# Patient Record
Sex: Female | Born: 1985 | Race: White | Hispanic: No | Marital: Married | Smoking: Former smoker
Health system: Southern US, Community
[De-identification: ages and names within clinical notes are randomized; demographics above are authoritative.]

## PROBLEM LIST (undated history)

## (undated) ENCOUNTER — Inpatient Hospital Stay (HOSPITAL_COMMUNITY): Payer: Self-pay

## (undated) DIAGNOSIS — B999 Unspecified infectious disease: Secondary | ICD-10-CM

## (undated) DIAGNOSIS — F329 Major depressive disorder, single episode, unspecified: Secondary | ICD-10-CM

## (undated) DIAGNOSIS — D649 Anemia, unspecified: Secondary | ICD-10-CM

## (undated) DIAGNOSIS — O24419 Gestational diabetes mellitus in pregnancy, unspecified control: Secondary | ICD-10-CM

## (undated) DIAGNOSIS — F419 Anxiety disorder, unspecified: Secondary | ICD-10-CM

## (undated) DIAGNOSIS — F32A Depression, unspecified: Secondary | ICD-10-CM

## (undated) DIAGNOSIS — G43909 Migraine, unspecified, not intractable, without status migrainosus: Secondary | ICD-10-CM

## (undated) HISTORY — PX: DILATION AND CURETTAGE OF UTERUS: SHX78

---
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
## 2006-02-21 ENCOUNTER — Ambulatory Visit (HOSPITAL_COMMUNITY): Admission: AD | Admit: 2006-02-21 | Discharge: 2006-02-21 | Payer: Self-pay | Admitting: Obstetrics and Gynecology

## 2006-03-06 ENCOUNTER — Inpatient Hospital Stay (HOSPITAL_COMMUNITY): Admission: RE | Admit: 2006-03-06 | Discharge: 2006-03-08 | Payer: Self-pay | Admitting: Obstetrics and Gynecology

## 2006-04-20 ENCOUNTER — Emergency Department (HOSPITAL_COMMUNITY): Admission: EM | Admit: 2006-04-20 | Discharge: 2006-04-21 | Payer: Self-pay | Admitting: Emergency Medicine

## 2006-04-21 ENCOUNTER — Emergency Department (HOSPITAL_COMMUNITY): Admission: EM | Admit: 2006-04-21 | Discharge: 2006-04-22 | Payer: Self-pay | Admitting: Emergency Medicine

## 2009-09-20 ENCOUNTER — Emergency Department (HOSPITAL_COMMUNITY): Admission: EM | Admit: 2009-09-20 | Discharge: 2009-09-20 | Payer: Self-pay | Admitting: Emergency Medicine

## 2010-04-26 ENCOUNTER — Other Ambulatory Visit
Admission: RE | Admit: 2010-04-26 | Discharge: 2010-04-26 | Payer: Self-pay | Source: Home / Self Care | Admitting: Family Medicine

## 2010-04-26 ENCOUNTER — Other Ambulatory Visit
Admission: RE | Admit: 2010-04-26 | Discharge: 2010-04-26 | Payer: Self-pay | Source: Home / Self Care | Admitting: Unknown Physician Specialty

## 2010-07-09 ENCOUNTER — Emergency Department (HOSPITAL_COMMUNITY)
Admission: EM | Admit: 2010-07-09 | Discharge: 2010-07-09 | Disposition: A | Discharge status: Home / Self Care | Payer: Medicaid Other | Attending: Emergency Medicine | Admitting: Emergency Medicine

## 2010-07-09 DIAGNOSIS — F17200 Nicotine dependence, unspecified, uncomplicated: Secondary | ICD-10-CM | POA: Insufficient documentation

## 2010-07-09 DIAGNOSIS — J029 Acute pharyngitis, unspecified: Secondary | ICD-10-CM | POA: Insufficient documentation

## 2010-07-09 LAB — RAPID STREP SCREEN (MED CTR MEBANE ONLY): Streptococcus, Group A Screen (Direct): NEGATIVE

## 2010-08-26 NOTE — Op Note (Signed)
NAMEKEIRI, SOLANO                   ACCOUNT NO.:  [id number]   MEDICAL RECORD NO.:  [id number]          PATIENT TYPE:  INP   LOCATION:  LDR2                          FACILITY:  [facility]   PHYSICIAN:  Tilda Burrow, M.D. DATE OF BIRTH:  05-06-1985   DATE OF PROCEDURE:  05/10/2004  DATE OF DISCHARGE:                                 OPERATIVE REPORT   DELIVERY NOTE:  The amnioinfusion did, in fact, improve the variable  decelerations, and so the patient was allowed to continue in laboring for  approximately 45 minutes.  An IUPC showed adequate labor, and at  approximately 1445, Dr. Emelda Fear arrived to the floor and upon checking the  patient, she was noted to be +2 station.  She pushed very effectively, and  the baby tolerated the pushing well with only mild variable decelerations.  Kayin had a spontaneous vaginal delivery at 1530.  The mouth and nose were  suctioned on the perineum with a bulb syringe.  A loose nuchal cord was  reduced without difficulty and the baby delivered without difficulty.  Apgars are 8 and 8, weight 5 pounds 15 ounces.  Twenty units of Pitocin  dilated in 1000 mL of lactated Ringer's was then infused rapidly IV.  The  placenta separated spontaneously and was delivered via controlled cord  traction at 1535.  It was inspected and appeared to be intact with a three-  vessel cord.  Of note, the umbilical cord was close to 2 feet long.  The  vagina was inspected, and no lacerations were found.  Estimated blood loss  200 mL.  The epidural catheter was then removed with the blue tip visualized  as being intact.      FC/MEDQ  D:  05/10/2004  T:  05/10/2004  Job:  [id number]   cc:   [facility]

## 2010-08-26 NOTE — Discharge Summary (Signed)
Emma Hughes                   ACCOUNT NO.:  [id number]   MEDICAL RECORD NO.:  [id number]          PATIENT TYPE:  INP   LOCATION:  A418                          FACILITY:  [facility]   PHYSICIAN:  Tilda Burrow, M.D. DATE OF BIRTH:  Jul 13, 1985   DATE OF ADMISSION:  07/25/2005  DATE OF DISCHARGE:  04/19/2007LH                                 DISCHARGE SUMMARY   ADMITTING DIAGNOSES:  1.  Pregnancy first trimester, uncertain dates.  2.  Bronchitis versus pneumonia.   DISCHARGE DIAGNOSES:  1.  Pregnancy six weeks gestation.  2.  Bronchitis, improved.   FOLLOWUP DISCHARGE MEDICATIONS:  1.  Keflex 500 q.i.d. x7 days.  2.  Phenergan with Codeine 4 ounces, 2 teaspoons q.4h. p.r.n. cough.   HOSPITAL SUMMARY:  This 25 year old female gravida 2, para 1, with uncertain  LMP, recently diagnosed with pregnancy, presents with headache, stuffy nose,  cough, fever to 103 when seen in the emergency room.  She was a smoker, but  quit a couple days ago.  She could no longer tolerate the cigarettes.  No  history of drug abuse or alcohol.  Last menstrual period May 15, 2005.  NO KNOWN DRUG ALLERGIES.  She was seen in the emergency room with documented  temperature elevation and with white count 19,400, 85 neutrophils,  hemoglobin 11.1, ketones present on urinalysis, chest showing kind of  chronic airway congestion.   HOSPITAL COURSE:  The patient was admitted for antibiotic therapy and  observation.  She had only one febrile episode after admission to 100.3 and  felt dramatically better on July 27, 2005.  Pharynx showed minimal  erythema, though that was her chief complaint, by the end of the day and on  July 27, 2005, she is discharged at 5 p.m. for followup for routine  prenatal 1 care.  Laboratory data were obtained as follows:  Quantitative  hCG 42,226, blood type O negative, antibody screen negative.  RPR  nonreactive, Rubella absent.  The rest of the prenatal 1 profile was  ordered,  but for some reason it is not in the chart, aerobic and anaerobic  cultures are negative, GC and Chlamydia cultures are negative.   PLAN:  Will draw prenatal 1 profile at followup visit.      Tilda Burrow, M.D.  Electronically Signed    JVF/MEDQ  D:  07/27/2005  T:  07/28/2005  Job:  [id number]

## 2010-08-26 NOTE — Op Note (Signed)
NAMEADORIA, KAWAMOTO                   ACCOUNT NO.:  [id number]   MEDICAL RECORD NO.:  [id number]          PATIENT TYPE:  INP   LOCATION:  A411                          FACILITY:  [facility]   PHYSICIAN:  Tilda Burrow, M.D. DATE OF BIRTH:  21-Jun-1985   DATE OF PROCEDURE:  05/10/2004  DATE OF DISCHARGE:  05/12/2004                                 OPERATIVE REPORT   PROCEDURE:  Continuous lumbar epidural placement was performed on May 10, 2004, in the morning hours.  The patient had epidural placed using loss  of resistance technique at approximately 8:35 a.m. after a fluid bolus of  700 mL lactated Ringer's with 5 mL test dose of 1.5% lidocaine with  epinephrine administered, followed by a continuous infusion of 12 mL/hr.  The patient was early in labor, so no initial bolus was considered  necessary.  The patient had satisfactory analgesic effect and with  continuous infusion of 12 mL/hr. progressed nicely and allowed her to rest  comfortably.      JVF/MEDQ  D:  05/17/2004  T:  05/17/2004  Job:  [id number]

## 2010-08-26 NOTE — H&P (Signed)
NAMEVIANNEY, KOPECKY                   ACCOUNT NO.:  [id number]   MEDICAL RECORD NO.:  [id number]          PATIENT TYPE:  INP   LOCATION:  LDR3                          FACILITY:  [facility]   PHYSICIAN:  Tilda Burrow, M.D. DATE OF BIRTH:  1985-12-08   DATE OF ADMISSION:  03/06/2006  DATE OF DISCHARGE:  LH                              HISTORY & PHYSICAL   CHIEF COMPLAINT:  Questionable rupture of membranes and labor.   HISTORY OF PRESENT ILLNESS:  Emma Hughes is a 25 year old gravida 2, para 1 with  a EDC of March 16, 2006 based on first and second trimester ultrasound  placing her at [redacted] weeks gestation.  She began prenatal care in her first  trimester and has had regular visits since then.  Her prenatal  course  has basically been uneventful.  Prenatal labs include blood type O  negative.  She did receive prophylactic RhoGAM at 28 weeks, rubella  immune, HBsAg, HIV, RPR, gonorrhea, chlamydia and group B strep are all  negative.  She is also HSV2 negative.  Blood pressures have been 100-  130s/50s-80s.  Total weight gain has been about 15 lb with appropriate  fundal height growth.   PAST MEDICAL HISTORY:  None.   PAST SURGICAL HISTORY:  None.   ALLERGIES:  No drug allergies.   MEDICATIONS:  1. Prenatal vitamins.  2. Tylenol 3 p.r.n.   FAMILY HISTORY:  Positive for cancer.   SOCIAL HISTORY:  Single, 8th grade education.  Denies smoking, alcohol  or drug use.   PAST OB HISTORY:  Had a vaginal delivery in 2006 of a 5 lb, 15 oz female  at term.  She did have rather severe postpartum depression.  She was  started on Lexapro 10 mg a couple of weeks ago and we will continue  that.   PHYSICAL EXAMINATION:  HEENT:  Within normal limits.  HEART:  Regular rate and rhythm.  LUNGS:  Clear.  ABDOMEN:  Soft, nontender.  Having moderate contractions every 2 to 3  minutes.  PELVIC EXAM:  Per Dr. Despina Hidden.  Attempted AROM was successful leading him  to believe that she was, in fact, ruptured this  morning.  CERVICAL EXAM:  Upon admission was 1, 25%, minus 2.  She is now 4, 80%,  zero station.  Fetal heart rate is reactive without decels.  She just  received an epidural and is very comfortable.  LEGS:  Negative.   IMPRESSION:  Intrauterine pregnancy at 38 weeks.  Active labor.  Reassuring maternal/fetal status.   PLAN:  Continue present management.      Jacklyn Shell, C.N.M.      Tilda Burrow, M.D.  Electronically Signed    FC/MEDQ  D:  03/06/2006  T:  03/06/2006  Job:  [id number]   cc:   [facility]   Francoise Schaumann. Milford Cage DO, FAAP  Fax: [phone]

## 2010-08-26 NOTE — Op Note (Signed)
NAMEJOYCIE, AERTS                   ACCOUNT NO.:  [id number]   MEDICAL RECORD NO.:  [id number]          PATIENT TYPE:  INP   LOCATION:  A402                          FACILITY:  [facility]   PHYSICIAN:  Lazaro Arms, M.D.   DATE OF BIRTH:  13-Jun-1985   DATE OF PROCEDURE:  DATE OF DISCHARGE:  03/08/2006                               OPERATIVE REPORT   After receiving her epidural, Zenovia progressed very nicely through labor.  She was noted to be completely dilated at +3 station with an urge to  push at approximately 11:45.  She pushed very effectively and brought  the baby to crowning position.  The baby rotated and just came out over  the next push.  The baby was a female and delivered at 12:24.  Apgars  are 9 and 9, weight 6 pounds 5 ounces.  Twenty units of Pitocin dilated  in 1000 mL of lactated Ringer's was infused rapidly IV.  The placenta  separated spontaneously and delivered via controlled cord traction at  12:30.  It was inspected and appears to be intact with a 3-vessel cord.  Estimated blood loss 300 mL.  The vagina was inspected and no  lacerations were found.      Jacklyn Shell, C.N.M.      Lazaro Arms, M.D.  Electronically Signed    FC/MEDQ  D:  03/09/2006  T:  03/09/2006  Job:  [id number]   cc:   Francoise Schaumann. Milford Cage DO, FAAP  Fax: [phone]

## 2010-08-26 NOTE — Op Note (Signed)
Emma Hughes, Emma Hughes                   ACCOUNT NO.:  [id number]   MEDICAL RECORD NO.:  [id number]          PATIENT TYPE:  INP   LOCATION:  LDR3                          FACILITY:  [facility]   PHYSICIAN:  Lazaro Arms, M.D.   DATE OF BIRTH:  05-Mar-1986   DATE OF PROCEDURE:  03/06/2006  DATE OF DISCHARGE:                                 OPERATIVE REPORT   INDICATIONS:  Emma Hughes is a 25 year old white female, gravida 2, para 1, at term,  requesting that epidural be placed.  She came in spontaneous labor.  She is  300 and minus 1.   DESCRIPTION OF PROCEDURE:  She was placed in the sitting position.  The area  was Betadine prepped.  One percent lidocaine was injected in the L3-4  interspace.  The area was sterilely draped.  A 17-gauge Tuohy needle was  used and loss-of-resistance technique employed.  The epidural space was  found with one pass with slight difficulty.  Bupivacaine plain 0.125% , 10  mL, was given as a test dose without ill effects.  The epidural catheter was  fed.  It was taped down 5 cm into the epidural space.  An additional 10 mL  was given through the catheter and continuous infusion was begun at 12 mL an  hour of 0.125% bupivacaine 2 mcg per mL of fentanyl.  The patient tolerated  the procedure well. She is getting good pain relief.  Blood pressure is  stable.  Fetal heart rate tracing is stable.      Lazaro Arms, M.D.  Electronically Signed     LHE/MEDQ  D:  03/06/2006  T:  03/06/2006  Job:  [id number]

## 2010-08-26 NOTE — H&P (Signed)
NAMECHASITEE, ZENKER                   ACCOUNT NO.:  [id number]   MEDICAL RECORD NO.:  [id number]          PATIENT TYPE:  OIB   LOCATION:  LDR2                          FACILITY:  [facility]   PHYSICIAN:  Tilda Burrow, M.D. DATE OF BIRTH:  28-Nov-1985   DATE OF ADMISSION:  02/21/2006  DATE OF DISCHARGE:  LH                                HISTORY & PHYSICAL   She was admitted at 1345 p.m. with irregular uterine contractions.  At that  time, cervix was 1-2 cm __________ closed.  Cervix is thick.  Presenting  part is out of the pelvis.   Exam at time of discharge which is 1715 is the same.  She is having  irregular uterine contractions.  There is fetal heart rate reactivity with  accelerations noted.  Discharge instructions were given to patient.  If  rupture of membranes or regular contractions occur, she is to come back or  let us know.  Also, she desires something for rest. We are going to give her  Nubain 10 and Phenergan 12.5 IM prior to discharge and discharge her home in  the care of her mother.      Zerita Boers, Lanier Clam      Tilda Burrow, M.D.  Electronically Signed    DL/MEDQ  D:  16/01/9603  T:  02/21/2006  Job:  [id number]   cc:   [facility]

## 2010-08-26 NOTE — H&P (Signed)
Emma Hughes, Emma Hughes                   ACCOUNT NO.:  [id number]   MEDICAL RECORD NO.:  [id number]          PATIENT TYPE:  INP   LOCATION:  A416                          FACILITY:  [facility]   PHYSICIAN:  Tilda Burrow, M.D. DATE OF BIRTH:  November 11, 1985   DATE OF ADMISSION:  06/02/2004  DATE OF DISCHARGE:  LH                                HISTORY & PHYSICAL   CHIEF COMPLAINT:  Severe postpartum depression with very limited resources.   HISTORY OF PRESENT ILLNESS:  Emma Hughes is a 25 year old patient who delivered a  baby vaginally approximately two weeks ago here at [facility].  She  had taken her baby to a check-up to see Dr. Stephania Fragmin approximately 10 days ago  where he said she just cried the whole visit.  Therefore, he referred her  over to our office where Dr. Emelda Fear spent some time counseling her and  started her on Lexapro for postpartum depression.  Due to her very limited  resources, she has no transportation, she is at home 12 hours a day by  herself, she does not have any family support whatsoever except for her  boyfriend who works 12 hours a day, [facility] was [facility] to [facility].  I got a call this afternoon from the home  health nurse stating that she has been visiting Emma Hughes and Brandyce has said that  she quit taking her Lexapro yesterday because she thought her thoughts of  suicide were worse.  The home health nurse drove her to the hospital where  she met Dr. Emelda Fear and I and stated that she has had thoughts of picking  up a knife and stabbing herself in her chest.  She does not think that she  would ever do this;  however, there is some uncertainty, and therefore we  admitted her for observation and with the hopes of getting her hooked up  with some resources here to help deal with her postpartum depression.  She  does have her baby with her.  She is breast-feeding and appears very loving  towards her baby.  At this time, we have no indication  whatsoever that she  would harm her baby.  Due to the late hour of the evening, we will keep her  here tonight and help her get hooked into some help with some community  resources in the morning.      FC/MEDQ  D:  06/02/2004  T:  06/02/2004  Job:  [id number]

## 2010-08-26 NOTE — H&P (Signed)
Emma Hughes, Emma Hughes                   ACCOUNT NO.:  [id number]   MEDICAL RECORD NO.:  [id number]          PATIENT TYPE:  INP   LOCATION:  LDR2                          FACILITY:  [facility]   PHYSICIAN:  Tilda Burrow, M.D. DATE OF BIRTH:  1985/10/12   DATE OF ADMISSION:  05/09/2004  DATE OF DISCHARGE:  LH                                HISTORY & PHYSICAL   HISTORY OF PRESENT ILLNESS:  Emma Hughes is a 25 year old, gravida 1, para 0, EDC is  May 07, 2004, who is at 40-2/[redacted] weeks gestation with spontaneous rupture  of membranes at 5 p.m.   PAST MEDICAL HISTORY:  Negative.   PAST SURGICAL HISTORY:  Negative.   ALLERGIES:  No known drug allergies.   SOCIAL HISTORY:  She is single and she lives with the father of the  pregnancy, and he is present and supportive.   FAMILY HISTORY:  Positive for diabetes and cancer.   LABORATORY DATA:  Prenatal course essentially uneventful.  Blood type is O  negative.  UDS was negative.  RhoGAM was given at 28 weeks.  Rubella is  immune.  Hepatitis B surface antigen is negative.  HIV is negative.  HSV is  negative.  Serologies nonreactive.  Pap within normal limits.  GC and  Chlamydia are negative.  AFP normal.  GBS is negative.  Hemoglobin at 28  weeks was 8.9.  Hematocrit at 28 weeks was 30.7.  Glucose 103.  The patient  has been noncompliant with _iron and vitamin_therapy during the course of  the pregnancy.   PHYSICAL EXAMINATION:  VITAL SIGNS:  Stable.  PELVIC:  Membranes are grossly ruptured.  Cervix is fingertip, thick, -2.  She is leaking clear fluid.  Fetal heart tone 130s with good accelerations  present.  Contractions are mild, 3 to 5 minutes apart.   PLAN:  We are going to admit.  If not in active labor by 5 a.m., we are  going to consider Pitocin augmentation at that time if she is less than 3  cm.      DL/MEDQ  D:  04/54/0981  T:  05/09/2004  Job:  [id number]

## 2010-12-15 ENCOUNTER — Encounter: Payer: Medicaid Other | Admitting: Obstetrics & Gynecology

## 2011-05-08 ENCOUNTER — Encounter (HOSPITAL_COMMUNITY): Payer: Self-pay | Admitting: Emergency Medicine

## 2011-05-08 ENCOUNTER — Emergency Department (HOSPITAL_COMMUNITY): Payer: PRIVATE HEALTH INSURANCE

## 2011-05-08 ENCOUNTER — Emergency Department (HOSPITAL_COMMUNITY)
Admission: EM | Admit: 2011-05-08 | Discharge: 2011-05-08 | Disposition: A | Discharge status: Home / Self Care | Payer: PRIVATE HEALTH INSURANCE | Attending: Emergency Medicine | Admitting: Emergency Medicine

## 2011-05-08 ENCOUNTER — Other Ambulatory Visit: Payer: Self-pay

## 2011-05-08 DIAGNOSIS — R110 Nausea: Secondary | ICD-10-CM | POA: Insufficient documentation

## 2011-05-08 DIAGNOSIS — F0781 Postconcussional syndrome: Secondary | ICD-10-CM | POA: Insufficient documentation

## 2011-05-08 DIAGNOSIS — Z79899 Other long term (current) drug therapy: Secondary | ICD-10-CM | POA: Insufficient documentation

## 2011-05-08 DIAGNOSIS — H53149 Visual discomfort, unspecified: Secondary | ICD-10-CM | POA: Insufficient documentation

## 2011-05-08 DIAGNOSIS — R51 Headache: Secondary | ICD-10-CM | POA: Insufficient documentation

## 2011-05-08 DIAGNOSIS — R42 Dizziness and giddiness: Secondary | ICD-10-CM | POA: Insufficient documentation

## 2011-05-08 MED ORDER — DIPHENHYDRAMINE HCL 50 MG/ML IJ SOLN
25.0000 mg | Freq: Once | INTRAMUSCULAR | Status: AC
  Administered 2011-05-08: 25 mg via INTRAVENOUS
  Filled 2011-05-08: qty 1

## 2011-05-08 MED ORDER — PROMETHAZINE HCL 25 MG/ML IJ SOLN
25.0000 mg | Freq: Once | INTRAMUSCULAR | Status: AC
  Administered 2011-05-08: 25 mg via INTRAVENOUS
  Filled 2011-05-08: qty 1

## 2011-05-08 MED ORDER — KETOROLAC TROMETHAMINE 60 MG/2ML IM SOLN
60.0000 mg | Freq: Once | INTRAMUSCULAR | Status: AC
  Administered 2011-05-08: 60 mg via INTRAMUSCULAR
  Filled 2011-05-08: qty 2

## 2011-05-08 NOTE — ED Notes (Signed)
Patient transported to CT 

## 2011-05-08 NOTE — ED Notes (Signed)
Pt states she has a hx of 2 concussions. Most recent concussion pt reports a month and a half ago. Pt states the concussions are a result of two falls. One fall in the bathroom and the most recent, pt reports falling down stairs. Pt c/o new onset of blurry vision, started on Saturday 05/06/11. Pt reports n/v for approx month to month and a half. Pt c/o photophobia, headaches for the same duration. Pt describes the headaches as intermittent, throbbing headaches behind the right eye, and pins and needles all over her head. Pt is A&O x4, GCS of 15, PERRLA, bilateral equal grips and strength, no neurological deficits noted. Pt confirmed she has a MRI on Friday of this week. Abbie RN notified of pt's condition

## 2011-05-08 NOTE — ED Notes (Signed)
Pt states tonight she is having headache and dizziness  Pt states she has had 2 concussions in the past couple months   Pt states she is scheduled for a MRI on Friday at [facility]  Pt states she has been forgetting things like her birthday etc  Pt states she has never experience light headedness like this before  Pt also states her feet keep going to sleep as well

## 2011-05-08 NOTE — ED Provider Notes (Signed)
History     CSN: [id number]  Arrival date & time 05/08/11  4098   First MD Initiated Contact with Patient 05/08/11 2110      Chief Complaint  Patient presents with  . Headache  . Dizziness    (Consider location/radiation/quality/duration/timing/severity/associated sxs/prior treatment) HPI Comments: Patient presents with diffuse headache the onset gradually around 5 PM today is associated with lightheadedness, dizziness, nausea, photophobia. Patient states she's had 2 concussions in the past 2 months. She was seen at [facility] negative he had CT after falling and hitting her head on the coffee table. She then fell a month later down the stairs and hit her head again. He is an MRI scheduled on Friday and sees a neurologist at [facility]. The headache tonight is bothering her to the point that she needed to come in. She denies any visual change, fever, numbness, weakness, tingling. She denies any chest pain or shortness of breath.  The history is provided by the patient.    History reviewed. No pertinent past medical history.  History reviewed. No pertinent past surgical history.  History reviewed. No pertinent family history.  History  Substance Use Topics  . Smoking status: Former Smoker    Types: Cigarettes  . Smokeless tobacco: Not on file  . Alcohol Use: No    OB History    Grav Para Term Preterm Abortions TAB SAB Ect Mult Living                  Review of Systems  Constitutional: Negative for activity change and appetite change.  HENT: Negative for congestion and rhinorrhea.   Respiratory: Negative for cough and shortness of breath.   Cardiovascular: Negative for chest pain.  Gastrointestinal: Positive for nausea. Negative for abdominal pain.  Genitourinary: Negative for dysuria, vaginal bleeding and vaginal discharge.  Musculoskeletal: Negative for back pain.  Skin: Negative for rash.  Neurological: Positive for dizziness, light-headedness and  headaches. Negative for weakness.    Allergies  Penicillins  Home Medications   Current Outpatient Rx  Name Route Sig Dispense Refill  . NORETHINDRONE 0.35 MG PO TABS Oral Take 1 tablet by mouth daily.    Marland Kitchen RIZATRIPTAN BENZOATE 10 MG PO TBDP Oral Take 10 mg by mouth as needed. May repeat in 2 hours if needed for comfort    . TOPIRAMATE 25 MG PO TABS Oral Take 75 mg by mouth at bedtime.      BP 121/71  Pulse 84  Temp(Src) 97.9 F (36.6 C) (Oral)  Resp 18  SpO2 98%  LMP 05/03/2011  Physical Exam  Constitutional: She appears well-developed and well-nourished. No distress.  HENT:  Head: Normocephalic and atraumatic.  Mouth/Throat: Oropharynx is clear and moist. No oropharyngeal exudate.  Eyes: Conjunctivae are normal. Pupils are equal, round, and reactive to light.  Neck: Normal range of motion. Neck supple.       No meningismus  Cardiovascular: Normal rate, regular rhythm and normal heart sounds.   Pulmonary/Chest: Effort normal and breath sounds normal. No respiratory distress.  Abdominal: Soft. There is no tenderness. There is no rebound and no guarding.  Musculoskeletal: Normal range of motion. She exhibits no edema and no tenderness.  Neurological: She is alert. No cranial nerve deficit.       5 out of 5 strength throughout, no focal deficits  Skin: Skin is warm.    ED Course  Procedures (including critical care time)  Labs Reviewed - No data to display Ct Head Wo Contrast  05/08/2011  *RADIOLOGY REPORT*  Clinical Data: Headache with dizziness.  History of concussion.  CT HEAD WITHOUT CONTRAST  Technique:  Contiguous axial images were obtained from the base of the skull through the vertex without contrast.  Comparison: 04/21/2006  Findings: The ventricles and sulci appear symmetrical.  No mass effect or midline shift.  No abnormal extra-axial fluid collections.  The gray-white matter junctions are distinct.  The basal cisterns are not effaced.  No evidence of acute  intracranial hemorrhage.  No ventricular dilatation.  Retention cyst demonstrated in the right maxillary antrum.  No acute air-fluid levels demonstrated in the paranasal sinuses.  No depressed skull fractures.  No significant change since previous study.  IMPRESSION: No evidence of acute intracranial hemorrhage, mass lesion, or acute infarct.  Original Report Authenticated By: Marlon Pel, M.D.     1. Postconcussive syndrome       MDM  Gradual onset headache similar to previous. History of concussion. With nausea, photophobia, lightheadedness appears to be postconcussive syndrome. We'll treat headache Patient already has followup with neurology.  CT head is negative. Patient's headache is improved. She has neurology followup with [facility].   Date: 05/08/2011  Rate: 83  Rhythm: normal sinus rhythm  QRS Axis: normal  Intervals: normal  ST/T Wave abnormalities: normal  Conduction Disutrbances:none  Narrative Interpretation:   Old EKG Reviewed: none available        Glynn Octave, MD 05/08/11 2342

## 2011-05-08 NOTE — ED Notes (Signed)
Pt states earlier she also experience some tightness in her chest and has a funny taste in her mouth

## 2012-04-10 HISTORY — PX: DILATION AND CURETTAGE OF UTERUS: SHX78

## 2012-06-01 ENCOUNTER — Encounter (HOSPITAL_COMMUNITY): Payer: Self-pay | Admitting: Emergency Medicine

## 2012-06-01 ENCOUNTER — Emergency Department (HOSPITAL_COMMUNITY)
Admission: EM | Admit: 2012-06-01 | Discharge: 2012-06-01 | Disposition: A | Discharge status: Home / Self Care | Payer: Medicaid Other | Attending: Emergency Medicine | Admitting: Emergency Medicine

## 2012-06-01 DIAGNOSIS — F17200 Nicotine dependence, unspecified, uncomplicated: Secondary | ICD-10-CM | POA: Insufficient documentation

## 2012-06-01 DIAGNOSIS — H9210 Otorrhea, unspecified ear: Secondary | ICD-10-CM | POA: Insufficient documentation

## 2012-06-01 DIAGNOSIS — H7290 Unspecified perforation of tympanic membrane, unspecified ear: Secondary | ICD-10-CM | POA: Insufficient documentation
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
## 2012-06-02 NOTE — ED Provider Notes (Signed)
Medical screening examination/treatment/procedure(s) were performed by non-physician practitioner and as supervising physician I was immediately available for consultation/collaboration.  Starlene Consuegra J. Mcihael Hinderman, MD 06/02/12 0752 

## 2013-04-10 NOTE — L&D Delivery Note (Signed)
Delivery Note At 9:26 AM a viable and healthy female was delivered via Vaginal, Spontaneous Delivery (Presentation: ; Occiput Anterior).  APGAR: , ; weight .   Placenta status: Intact, Spontaneous.  Cord: 3 vessels with the following complications: None.   Anesthesia: Epidural  Episiotomy: None Lacerations: None Suture Repair: n/a Est. Blood Loss (mL): 300  Mom to postpartum.  Baby to Couplet care / Skin to Skin.  Essie Hart STACIA 01/04/2014, 9:42 AM

## 2013-05-15 ENCOUNTER — Other Ambulatory Visit: Payer: Self-pay

## 2013-05-15 LAB — OB RESULTS CONSOLE HIV ANTIBODY (ROUTINE TESTING): HIV: NONREACTIVE

## 2013-05-15 LAB — OB RESULTS CONSOLE HEPATITIS B SURFACE ANTIGEN: HEP B S AG: NEGATIVE

## 2013-05-15 LAB — OB RESULTS CONSOLE GC/CHLAMYDIA
CHLAMYDIA, DNA PROBE: NEGATIVE
GC PROBE AMP, GENITAL: NEGATIVE

## 2013-05-15 LAB — OB RESULTS CONSOLE RUBELLA ANTIBODY, IGM: RUBELLA: IMMUNE

## 2013-05-15 LAB — OB RESULTS CONSOLE ANTIBODY SCREEN: ANTIBODY SCREEN: NEGATIVE

## 2013-05-15 LAB — OB RESULTS CONSOLE RPR: RPR: NONREACTIVE

## 2013-05-23 ENCOUNTER — Inpatient Hospital Stay (HOSPITAL_COMMUNITY)
Admission: AD | Admit: 2013-05-23 | Discharge: 2013-05-23 | Disposition: A | Discharge status: Home / Self Care | Payer: Medicaid Other | Source: Ambulatory Visit | Attending: Obstetrics and Gynecology | Admitting: Obstetrics and Gynecology

## 2013-05-23 ENCOUNTER — Inpatient Hospital Stay (HOSPITAL_COMMUNITY): Payer: Medicaid Other

## 2013-05-23 ENCOUNTER — Encounter (HOSPITAL_COMMUNITY): Payer: Self-pay | Admitting: *Deleted

## 2013-05-23 DIAGNOSIS — O26899 Other specified pregnancy related conditions, unspecified trimester: Secondary | ICD-10-CM

## 2013-05-23 DIAGNOSIS — G43909 Migraine, unspecified, not intractable, without status migrainosus: Secondary | ICD-10-CM | POA: Insufficient documentation

## 2013-05-23 DIAGNOSIS — F411 Generalized anxiety disorder: Secondary | ICD-10-CM | POA: Insufficient documentation

## 2013-05-23 DIAGNOSIS — R109 Unspecified abdominal pain: Secondary | ICD-10-CM | POA: Insufficient documentation

## 2013-05-23 DIAGNOSIS — Z87891 Personal history of nicotine dependence: Secondary | ICD-10-CM | POA: Insufficient documentation

## 2013-05-23 DIAGNOSIS — O9989 Other specified diseases and conditions complicating pregnancy, childbirth and the puerperium: Principal | ICD-10-CM

## 2013-05-23 DIAGNOSIS — O99891 Other specified diseases and conditions complicating pregnancy: Secondary | ICD-10-CM | POA: Insufficient documentation

## 2013-05-23 HISTORY — DX: Anxiety disorder, unspecified: F41.9

## 2013-05-23 HISTORY — DX: Migraine, unspecified, not intractable, without status migrainosus: G43.909

## 2013-05-23 LAB — CBC
HEMATOCRIT: 39.3 % (ref 36.0–46.0)
Hemoglobin: 13.4 g/dL (ref 12.0–15.0)
MCH: 28.2 pg (ref 26.0–34.0)
MCHC: 34.1 g/dL (ref 30.0–36.0)
MCV: 82.7 fL (ref 78.0–100.0)
PLATELETS: 230 10*3/uL (ref 150–400)
RBC: 4.75 MIL/uL (ref 3.87–5.11)
RDW: 15.2 % (ref 11.5–15.5)
WBC: 14.5 10*3/uL — ABNORMAL HIGH (ref 4.0–10.5)

## 2013-05-23 LAB — URINE MICROSCOPIC-ADD ON

## 2013-05-23 LAB — URINALYSIS, ROUTINE W REFLEX MICROSCOPIC
BILIRUBIN URINE: NEGATIVE
GLUCOSE, UA: NEGATIVE mg/dL
Hgb urine dipstick: NEGATIVE
KETONES UR: NEGATIVE mg/dL
Nitrite: NEGATIVE
PH: 6 (ref 5.0–8.0)
Protein, ur: NEGATIVE mg/dL
Specific Gravity, Urine: 1.02 (ref 1.005–1.030)
Urobilinogen, UA: 0.2 mg/dL (ref 0.0–1.0)

## 2013-05-23 LAB — WET PREP, GENITAL
Clue Cells Wet Prep HPF POC: NONE SEEN
TRICH WET PREP: NONE SEEN
YEAST WET PREP: NONE SEEN

## 2013-05-23 LAB — POCT PREGNANCY, URINE: Preg Test, Ur: POSITIVE — AB

## 2013-05-23 MED ORDER — PROMETHAZINE HCL 25 MG PO TABS
25.0000 mg | ORAL_TABLET | Freq: Once | ORAL | Status: AC
  Administered 2013-05-23: 25 mg via ORAL
  Filled 2013-05-23: qty 1

## 2013-05-23 MED ORDER — GI COCKTAIL ~~LOC~~
30.0000 mL | Freq: Once | ORAL | Status: AC
  Administered 2013-05-23: 30 mL via ORAL
  Filled 2013-05-23: qty 30

## 2013-05-23 MED ORDER — PROMETHAZINE HCL 25 MG PO TABS: 25.0000 mg | ORAL_TABLET | Freq: Four times a day (QID) | ORAL | Status: DC | PRN

## 2013-05-23 MED ORDER — ONDANSETRON 8 MG PO TBDP
8.0000 mg | ORAL_TABLET | Freq: Once | ORAL | Status: AC
  Administered 2013-05-23: 8 mg via ORAL
  Filled 2013-05-23: qty 1

## 2013-05-23 NOTE — MAU Note (Signed)
Patient presents to MAU with c/o severe mid abdominal pain that started 2 hours ago. Has not taken anything for pain. Denies vaginal bleeding or discharge at this time. Nausea reported.

## 2013-05-23 NOTE — MAU Provider Note (Signed)
History     CSN: [id number]  Arrival date and time: 05/23/13 1254   First Provider Initiated Contact with Patient 05/23/13 1325      Chief Complaint  Patient presents with  . Abdominal Cramping   HPI Comments: Emma Hughes 28 y.o. [id number] presents to MAU for abdominal cramping that started 2 hours ago. Nothing seems to have started it. No bleeding. It is '9' on 1/10 scale. She has taken no medications and declines any in MAU. She has nausea no vomiting, no fever.  [facility] and was reassured .  Abdominal Cramping Associated symptoms include nausea. Pertinent negatives include no vomiting.      Past Medical History  Diagnosis Date  . Anxiety   . Migraines     Past Surgical History  Procedure Laterality Date  . Dilation and curettage of uterus      History reviewed. No pertinent family history.  History  Substance Use Topics  . Smoking status: Former Smoker -- 0.25 packs/day    Types: Cigarettes  . Smokeless tobacco: Never Used  . Alcohol Use: No     Comment: monthly, pina colada    Allergies:  Allergies  Allergen Reactions  . Penicillins Anaphylaxis    Prescriptions prior to admission  Medication Sig Dispense Refill  . Butalbital-APAP-Caffeine 50-300-40 MG CAPS Take 1 capsule by mouth every 6 (six) hours as needed (for headaches).        Review of Systems  Constitutional: Negative.   HENT: Negative.   Eyes: Negative.   Respiratory: Negative.   Cardiovascular: Negative.   Gastrointestinal: Positive for nausea and abdominal pain. Negative for vomiting.  Genitourinary: Negative.   Musculoskeletal: Negative.   Skin: Negative.   Neurological: Negative.   Endo/Heme/Allergies: Negative.   Psychiatric/Behavioral: Negative.    Physical Exam   Blood pressure 135/82, pulse 93, temperature 98.2 F (36.8 C), temperature source Oral, resp. rate 20, height 5\' 2"  (1.575 m), weight 81.738 kg (180 lb 3.2 oz), last menstrual period  03/29/2013, SpO2 100.00%.  Physical Exam  Constitutional: She is oriented to person, place, and time. She appears well-developed and well-nourished. She appears distressed.  crying  HENT:  Head: Normocephalic and atraumatic.  Eyes: Pupils are equal, round, and reactive to light.  Cardiovascular: Normal rate, regular rhythm and normal heart sounds.   Respiratory: Breath sounds normal.  GI: Soft. Bowel sounds are normal. She exhibits no distension and no mass. There is no tenderness. There is no rebound and no guarding.  Genitourinary: Vagina normal and uterus normal. No vaginal discharge found.  Musculoskeletal: Normal range of motion.  Neurological: She is oriented to person, place, and time.  Skin: Skin is warm and dry.  Psychiatric: Her behavior is normal. Judgment and thought content normal. Her mood appears anxious.   Results for orders placed during the hospital encounter of 05/23/13 (from the past 24 hour(s))  URINALYSIS, ROUTINE W REFLEX MICROSCOPIC     Status: Abnormal   Collection Time    05/23/13  1:09 PM      Result Value Ref Range   Color, Urine YELLOW  YELLOW   APPearance CLEAR  CLEAR   Specific Gravity, Urine 1.020  1.005 - 1.030   pH 6.0  5.0 - 8.0   Glucose, UA NEGATIVE  NEGATIVE mg/dL   Hgb urine dipstick NEGATIVE  NEGATIVE   Bilirubin Urine NEGATIVE  NEGATIVE   Ketones, ur NEGATIVE  NEGATIVE mg/dL   Protein, ur NEGATIVE  NEGATIVE mg/dL  Urobilinogen, UA 0.2  0.0 - 1.0 mg/dL   Nitrite NEGATIVE  NEGATIVE   Leukocytes, UA TRACE (*) NEGATIVE  URINE MICROSCOPIC-ADD ON     Status: Abnormal   Collection Time    05/23/13  1:09 PM      Result Value Ref Range   Squamous Epithelial / LPF MANY (*) RARE   WBC, UA 3-6  <3 WBC/hpf   RBC / HPF 0-2  <3 RBC/hpf   Bacteria, UA FEW (*) RARE  POCT PREGNANCY, URINE     Status: Abnormal   Collection Time    05/23/13  1:13 PM      Result Value Ref Range   Preg Test, Ur POSITIVE (*) NEGATIVE  WET PREP, GENITAL     Status:  Abnormal   Collection Time    05/23/13  1:40 PM      Result Value Ref Range   Yeast Wet Prep HPF POC NONE SEEN  NONE SEEN   Trich, Wet Prep NONE SEEN  NONE SEEN   Clue Cells Wet Prep HPF POC NONE SEEN  NONE SEEN   WBC, Wet Prep HPF POC MODERATE (*) NONE SEEN  CBC     Status: Abnormal   Collection Time    05/23/13  1:48 PM      Result Value Ref Range   WBC 14.5 (*) 4.0 - 10.5 K/uL   RBC 4.75  3.87 - 5.11 MIL/uL   Hemoglobin 13.4  12.0 - 15.0 g/dL   HCT 16.1  09.6 - 04.5 %   MCV 82.7  78.0 - 100.0 fL   MCH 28.2  26.0 - 34.0 pg   MCHC 34.1  30.0 - 36.0 g/dL   RDW 40.9  81.1 - 91.4 %   Platelets 230  150 - 400 K/uL   US Ob Comp Less 14 Wks  05/23/2013   CLINICAL DATA:  Pelvic pain.  EXAM: OBSTETRIC <14 WK ULTRASOUND  TECHNIQUE: Transabdominal ultrasound was performed for evaluation of the gestation as well as the maternal uterus and adnexal regions.  COMPARISON:  08/22/2012  FINDINGS: Intrauterine gestational sac: Visualized/normal in shape.  Yolk sac:  Present  Embryo:  Present  Cardiac Activity: Present  Heart Rate: 150 bpm  CRL:   9.7  Mm   7 w 1 d                  [place] EDC: 01/08/2014  Maternal uterus/adnexae:  No subcortical hemorrhage  Normal right ovary.  Normal left ovary.  No free pelvic fluid collections.  IMPRESSION: Single living intrauterine embryo estimated at 7 weeks and 1 day gestation.  No subchorionic hemorrhage.  Normal ovaries.   Electronically Signed   By: Loralie Champagne M.D.   On: 05/23/2013 14:39     MAU Course  Procedures  MDM  CBC, wet prep, U/S Spoke with Dr Henderson Cloud GI cocktail, phenergan 25 mg po  3:30 pain is gone completely/ feels more nauseated / threw up phenergan Zofran 8 mg po/ 4:12 pain and nausea gone Assessment and Plan   A: Abdominal pain in pregnancy  P: Above orders Will send home with phenergan Advised if pain returns call Dr Valorie Roosevelt, Rubbie Battiest 05/23/2013, 1:45 PM

## 2013-05-23 NOTE — Discharge Instructions (Signed)
Abdominal Pain During Pregnancy °Abdominal pain is common in pregnancy. Most of the time, it does not cause harm. There are many causes of abdominal pain. Some causes are more serious than others. Some of the causes of abdominal pain in pregnancy are easily diagnosed. Occasionally, the diagnosis takes time to understand. Other times, the cause is not determined. Abdominal pain can be a sign that something is very wrong with the pregnancy, or the pain may have nothing to do with the pregnancy at all. For this reason, always tell your health care provider if you have any abdominal discomfort. °HOME CARE INSTRUCTIONS  °Monitor your abdominal pain for any changes. The following actions may help to alleviate any discomfort you are experiencing: °· Do not have sexual intercourse or put anything in your vagina until your symptoms go away completely. °· Get plenty of rest until your pain improves. °· Drink clear fluids if you feel nauseous. Avoid solid food as long as you are uncomfortable or nauseous. °· Only take over-the-counter or prescription medicine as directed by your health care provider. °· Keep all follow-up appointments with your health care provider. °SEEK IMMEDIATE MEDICAL CARE IF: °· You are bleeding, leaking fluid, or passing tissue from the vagina. °· You have increasing pain or cramping. °· You have persistent vomiting. °· You have painful or bloody urination. °· You have a fever. °· You notice a decrease in your baby's movements. °· You have extreme weakness or feel faint. °· You have shortness of breath, with or without abdominal pain. °· You develop a severe headache with abdominal pain. °· You have abnormal vaginal discharge with abdominal pain. °· You have persistent diarrhea. °· You have abdominal pain that continues even after rest, or gets worse. °MAKE SURE YOU:  °· Understand these instructions. °· Will watch your condition. °· Will get help right away if you are not doing well or get  worse. °Document Released: 03/27/2005 Document Revised: 01/15/2013 Document Reviewed: 10/24/2012 °ExitCare® Patient Information ©2014 [employer]. ° °

## 2013-10-17 LAB — OB RESULTS CONSOLE ABO/RH: RH Type: NEGATIVE

## 2013-11-19 ENCOUNTER — Inpatient Hospital Stay (HOSPITAL_COMMUNITY)
Admission: AD | Admit: 2013-11-19 | Discharge: 2013-11-19 | Disposition: A | Discharge status: Home / Self Care | Payer: Medicaid Other | Source: Ambulatory Visit | Attending: Obstetrics and Gynecology | Admitting: Obstetrics and Gynecology

## 2013-11-19 ENCOUNTER — Encounter (HOSPITAL_COMMUNITY): Payer: Self-pay | Admitting: *Deleted

## 2013-11-19 DIAGNOSIS — O9989 Other specified diseases and conditions complicating pregnancy, childbirth and the puerperium: Secondary | ICD-10-CM

## 2013-11-19 DIAGNOSIS — M549 Dorsalgia, unspecified: Secondary | ICD-10-CM

## 2013-11-19 DIAGNOSIS — O99891 Other specified diseases and conditions complicating pregnancy: Secondary | ICD-10-CM | POA: Diagnosis not present

## 2013-11-19 DIAGNOSIS — R109 Unspecified abdominal pain: Secondary | ICD-10-CM | POA: Insufficient documentation

## 2013-11-19 DIAGNOSIS — Z87891 Personal history of nicotine dependence: Secondary | ICD-10-CM | POA: Insufficient documentation

## 2013-11-19 DIAGNOSIS — O26899 Other specified pregnancy related conditions, unspecified trimester: Secondary | ICD-10-CM

## 2013-11-19 LAB — URINALYSIS, ROUTINE W REFLEX MICROSCOPIC
BILIRUBIN URINE: NEGATIVE
Glucose, UA: NEGATIVE mg/dL
HGB URINE DIPSTICK: NEGATIVE
KETONES UR: NEGATIVE mg/dL
Leukocytes, UA: NEGATIVE
NITRITE: NEGATIVE
PH: 7 (ref 5.0–8.0)
Protein, ur: NEGATIVE mg/dL
Specific Gravity, Urine: 1.01 (ref 1.005–1.030)
Urobilinogen, UA: 0.2 mg/dL (ref 0.0–1.0)

## 2013-11-19 NOTE — Discharge Instructions (Signed)

## 2013-11-19 NOTE — MAU Note (Signed)
Pt states she has been having pain since this morning at 4am. Pt states pain woke her up and she was able to go back to sleep for about 1 1/2 hours.. Pain has continues.

## 2013-11-19 NOTE — MAU Provider Note (Signed)
History     CSN: [id number]  Arrival date and time: 11/19/13 1518   First Provider Initiated Contact with Patient 11/19/13 1716      Chief Complaint  Patient presents with  . Abdominal Pain   HPI  Ms. Charletha R Marlaine Hind is a 28 y.o. female G4P2011 at [redacted]w[redacted]d who presents with complaints of abdominal pain that started around 0400; she describes the pain as "cramping" along with back pain. Denies vaginal bleeding or leaking of fluid. Baby is moving well and active. No other problems in this pregnancy; no history of preterm labor.   OB History   Grav Para Term Preterm Abortions TAB SAB Ect Mult Living   4 2 2  1  1   1       Past Medical History  Diagnosis Date  . Anxiety   . Migraines     Past Surgical History  Procedure Laterality Date  . Dilation and curettage of uterus      Family History  Problem Relation Age of Onset  . Stroke Mother   . Heart disease Father     History  Substance Use Topics  . Smoking status: Former Smoker -- 0.25 packs/day    Types: Cigarettes  . Smokeless tobacco: Never Used  . Alcohol Use: No     Comment: monthly, pina colada    Allergies:  Allergies  Allergen Reactions  . Penicillins Anaphylaxis    Prescriptions prior to admission  Medication Sig Dispense Refill  . acetaminophen (TYLENOL) 325 MG tablet Take 650 mg by mouth every 6 (six) hours as needed for moderate pain.      . Pediatric Multivit-Minerals-C (FLINTSTONES GUMMIES PO) Take 2 tablets by mouth daily.      . ranitidine (ZANTAC) 75 MG tablet Take 75 mg by mouth daily.        Results for orders placed during the hospital encounter of 11/19/13 (from the past 48 hour(s))  URINALYSIS, ROUTINE W REFLEX MICROSCOPIC     Status: None   Collection Time    11/19/13  3:31 PM      Result Value Ref Range   Color, Urine YELLOW  YELLOW   APPearance CLEAR  CLEAR   Specific Gravity, Urine 1.010  1.005 - 1.030   pH 7.0  5.0 - 8.0   Glucose, UA NEGATIVE  NEGATIVE mg/dL   Hgb urine  dipstick NEGATIVE  NEGATIVE   Bilirubin Urine NEGATIVE  NEGATIVE   Ketones, ur NEGATIVE  NEGATIVE mg/dL   Protein, ur NEGATIVE  NEGATIVE mg/dL   Urobilinogen, UA 0.2  0.0 - 1.0 mg/dL   Nitrite NEGATIVE  NEGATIVE   Leukocytes, UA NEGATIVE  NEGATIVE   Comment: MICROSCOPIC NOT DONE ON URINES WITH NEGATIVE PROTEIN, BLOOD, LEUKOCYTES, NITRITE, OR GLUCOSE <1000 mg/dL.    Review of Systems  Constitutional: Negative for fever and chills.  Gastrointestinal: Positive for abdominal pain (Bilateral lower abdominal pain ). Negative for diarrhea and constipation.  Genitourinary: Negative for dysuria, urgency, frequency and hematuria.       No vaginal discharge. No vaginal bleeding. No dysuria.   Musculoskeletal: Positive for back pain.   Physical Exam   Blood pressure 119/81, pulse 113, temperature 98.7 F (37.1 C), temperature source Oral, resp. rate 18, weight 82.827 kg (182 lb 9.6 oz), last menstrual period 03/29/2013.  Physical Exam  Constitutional: She is oriented to person, place, and time. She appears well-developed and well-nourished. No distress.  HENT:  Head: Normocephalic.  Eyes: Pupils are equal, round, and reactive  to light.  Neck: Neck supple.  Respiratory: Effort normal.  GI: Soft.  Genitourinary:  Speculum exam: Vagina - Small amount of creamy discharge, no odor Cervix - No contact bleeding Bimanual exam: FT, thick, anterior , -2 Chaperone present for exam.   Musculoskeletal: Normal range of motion.  Neurological: She is alert and oriented to person, place, and time.  Skin: Skin is warm. She is not diaphoretic.  Psychiatric: Her behavior is normal.   Fetal Tracing:  Baseline:140 bpm Variability: Moderate  Accelerations: 15x15 Decelerations: None Toco: occasional UI     MAU Course  Procedures None  MDM  UA  NST  Discussed patient with Dr. Claiborne Billingsallahan at [phone]. Discussed fetal tracing and physical exam.   Assessment and Plan   A:  Abdominal pain in  pregnancy   P:  Discharge home in stable condition Preterm labor precautions discussed Return to MAU as needed, if pain worsens Kick counts Follow up in the office as scheduled.   Iona HansenJennifer Irene Ysenia Filice, NP  11/20/2013, 7:34 PM

## 2013-11-19 NOTE — MAU Note (Signed)
Urine in lab 

## 2013-11-19 NOTE — Progress Notes (Signed)
Pt feeling increasingly nauseous at this time.

## 2013-12-10 LAB — OB RESULTS CONSOLE GBS: GBS: NEGATIVE

## 2014-01-03 ENCOUNTER — Inpatient Hospital Stay (HOSPITAL_COMMUNITY)
Admission: AD | Admit: 2014-01-03 | Discharge: 2014-01-05 | DRG: 775 | Disposition: A | Discharge status: Home / Self Care | Payer: Medicaid Other | Source: Ambulatory Visit | Attending: Obstetrics & Gynecology | Admitting: Obstetrics & Gynecology

## 2014-01-03 ENCOUNTER — Encounter (HOSPITAL_COMMUNITY): Payer: Self-pay

## 2014-01-03 DIAGNOSIS — Z823 Family history of stroke: Secondary | ICD-10-CM

## 2014-01-03 DIAGNOSIS — Z87891 Personal history of nicotine dependence: Secondary | ICD-10-CM

## 2014-01-03 NOTE — MAU Note (Signed)
Call to DAna RN in BS to see if pt can come down from Triage. Annabelle Harman RN will call back to MAU. Pt to BR to change pad. Aware of plan

## 2014-01-04 ENCOUNTER — Encounter (HOSPITAL_COMMUNITY): Payer: Self-pay

## 2014-01-04 ENCOUNTER — Inpatient Hospital Stay (HOSPITAL_COMMUNITY): Payer: Medicaid Other | Admitting: Anesthesiology

## 2014-01-04 ENCOUNTER — Encounter (HOSPITAL_COMMUNITY): Payer: Medicaid Other | Admitting: Anesthesiology

## 2014-01-04 DIAGNOSIS — O99891 Other specified diseases and conditions complicating pregnancy: Secondary | ICD-10-CM | POA: Diagnosis present

## 2014-01-04 DIAGNOSIS — Z87891 Personal history of nicotine dependence: Secondary | ICD-10-CM | POA: Diagnosis not present

## 2014-01-04 DIAGNOSIS — Z823 Family history of stroke: Secondary | ICD-10-CM | POA: Diagnosis not present

## 2014-01-04 LAB — POCT FERN TEST: POCT FERN TEST: POSITIVE

## 2014-01-04 LAB — CBC
HCT: 33.6 % — ABNORMAL LOW (ref 36.0–46.0)
Hemoglobin: 10.7 g/dL — ABNORMAL LOW (ref 12.0–15.0)
MCH: 24.9 pg — AB (ref 26.0–34.0)
MCHC: 31.8 g/dL (ref 30.0–36.0)
MCV: 78.3 fL (ref 78.0–100.0)
Platelets: 257 10*3/uL (ref 150–400)
RBC: 4.29 MIL/uL (ref 3.87–5.11)
RDW: 15.2 % (ref 11.5–15.5)
WBC: 12.4 10*3/uL — ABNORMAL HIGH (ref 4.0–10.5)

## 2014-01-04 LAB — RPR

## 2014-01-04 MED ORDER — CITRIC ACID-SODIUM CITRATE 334-500 MG/5ML PO SOLN
30.0000 mL | ORAL | Status: DC | PRN
  Administered 2014-01-04: 30 mL via ORAL
  Filled 2014-01-04: qty 15

## 2014-01-04 MED ORDER — ONDANSETRON HCL 4 MG/2ML IJ SOLN
4.0000 mg | Freq: Four times a day (QID) | INTRAMUSCULAR | Status: DC | PRN
  Administered 2014-01-04: 4 mg via INTRAVENOUS
  Filled 2014-01-04: qty 2

## 2014-01-04 MED ORDER — LACTATED RINGERS IV SOLN: INTRAVENOUS | Status: DC

## 2014-01-04 MED ORDER — ZOLPIDEM TARTRATE 5 MG PO TABS: 5.0000 mg | ORAL_TABLET | Freq: Every evening | ORAL | Status: DC | PRN

## 2014-01-04 MED ORDER — OXYTOCIN BOLUS FROM INFUSION: 500.0000 mL | INTRAVENOUS | Status: DC

## 2014-01-04 MED ORDER — OXYCODONE-ACETAMINOPHEN 5-325 MG PO TABS: 1.0000 | ORAL_TABLET | ORAL | Status: DC | PRN

## 2014-01-04 MED ORDER — OXYTOCIN 40 UNITS IN LACTATED RINGERS INFUSION - SIMPLE MED
1.0000 m[IU]/min | INTRAVENOUS | Status: DC
  Administered 2014-01-04: 2 m[IU]/min via INTRAVENOUS

## 2014-01-04 MED ORDER — PRENATAL MULTIVITAMIN CH
1.0000 | ORAL_TABLET | Freq: Every day | ORAL | Status: DC
  Filled 2014-01-04 (×2): qty 1

## 2014-01-04 MED ORDER — LACTATED RINGERS IV SOLN: 500.0000 mL | Freq: Once | INTRAVENOUS | Status: DC

## 2014-01-04 MED ORDER — LIDOCAINE HCL (PF) 1 % IJ SOLN
30.0000 mL | INTRAMUSCULAR | Status: DC | PRN
  Filled 2014-01-04: qty 30

## 2014-01-04 MED ORDER — DIPHENHYDRAMINE HCL 50 MG/ML IJ SOLN: 12.5000 mg | INTRAMUSCULAR | Status: DC | PRN

## 2014-01-04 MED ORDER — DIPHENHYDRAMINE HCL 25 MG PO CAPS: 25.0000 mg | ORAL_CAPSULE | Freq: Four times a day (QID) | ORAL | Status: DC | PRN

## 2014-01-04 MED ORDER — BENZOCAINE-MENTHOL 20-0.5 % EX AERO: 1.0000 "application " | INHALATION_SPRAY | CUTANEOUS | Status: DC | PRN

## 2014-01-04 MED ORDER — LACTATED RINGERS IV SOLN
INTRAVENOUS | Status: DC
  Administered 2014-01-04 (×2): via INTRAVENOUS

## 2014-01-04 MED ORDER — IBUPROFEN 600 MG PO TABS
600.0000 mg | ORAL_TABLET | Freq: Four times a day (QID) | ORAL | Status: DC
  Administered 2014-01-04 – 2014-01-05 (×5): 600 mg via ORAL
  Filled 2014-01-04 (×5): qty 1

## 2014-01-04 MED ORDER — DIBUCAINE 1 % RE OINT: 1.0000 "application " | TOPICAL_OINTMENT | RECTAL | Status: DC | PRN

## 2014-01-04 MED ORDER — OXYCODONE-ACETAMINOPHEN 5-325 MG PO TABS: 2.0000 | ORAL_TABLET | ORAL | Status: DC | PRN

## 2014-01-04 MED ORDER — SIMETHICONE 80 MG PO CHEW: 80.0000 mg | CHEWABLE_TABLET | ORAL | Status: DC | PRN

## 2014-01-04 MED ORDER — ACETAMINOPHEN 325 MG PO TABS: 650.0000 mg | ORAL_TABLET | ORAL | Status: DC | PRN

## 2014-01-04 MED ORDER — PHENYLEPHRINE 40 MCG/ML (10ML) SYRINGE FOR IV PUSH (FOR BLOOD PRESSURE SUPPORT)
80.0000 ug | PREFILLED_SYRINGE | INTRAVENOUS | Status: DC | PRN
  Administered 2014-01-04: 80 ug via INTRAVENOUS
  Filled 2014-01-04: qty 2

## 2014-01-04 MED ORDER — FENTANYL 2.5 MCG/ML BUPIVACAINE 1/10 % EPIDURAL INFUSION (WH - ANES)
INTRAMUSCULAR | Status: DC | PRN
  Administered 2014-01-04: 14 mL/h via EPIDURAL

## 2014-01-04 MED ORDER — SENNOSIDES-DOCUSATE SODIUM 8.6-50 MG PO TABS
2.0000 | ORAL_TABLET | ORAL | Status: DC
  Administered 2014-01-04: 2 via ORAL
  Filled 2014-01-04: qty 2

## 2014-01-04 MED ORDER — EPHEDRINE 5 MG/ML INJ
10.0000 mg | INTRAVENOUS | Status: DC | PRN
  Filled 2014-01-04: qty 2

## 2014-01-04 MED ORDER — LIDOCAINE HCL (PF) 1 % IJ SOLN
INTRAMUSCULAR | Status: DC | PRN
  Administered 2014-01-04 (×2): 8 mL

## 2014-01-04 MED ORDER — ONDANSETRON HCL 4 MG/2ML IJ SOLN: 4.0000 mg | INTRAMUSCULAR | Status: DC | PRN

## 2014-01-04 MED ORDER — FLEET ENEMA 7-19 GM/118ML RE ENEM: 1.0000 | ENEMA | RECTAL | Status: DC | PRN

## 2014-01-04 MED ORDER — ONDANSETRON HCL 4 MG PO TABS: 4.0000 mg | ORAL_TABLET | ORAL | Status: DC | PRN

## 2014-01-04 MED ORDER — FENTANYL 2.5 MCG/ML BUPIVACAINE 1/10 % EPIDURAL INFUSION (WH - ANES)
14.0000 mL/h | INTRAMUSCULAR | Status: DC | PRN
  Filled 2014-01-04: qty 125

## 2014-01-04 MED ORDER — LANOLIN HYDROUS EX OINT: TOPICAL_OINTMENT | CUTANEOUS | Status: DC | PRN

## 2014-01-04 MED ORDER — PHENYLEPHRINE 40 MCG/ML (10ML) SYRINGE FOR IV PUSH (FOR BLOOD PRESSURE SUPPORT)
80.0000 ug | PREFILLED_SYRINGE | INTRAVENOUS | Status: DC | PRN
  Filled 2014-01-04: qty 2
  Filled 2014-01-04: qty 10

## 2014-01-04 MED ORDER — LACTATED RINGERS IV SOLN: 500.0000 mL | INTRAVENOUS | Status: DC | PRN

## 2014-01-04 MED ORDER — OXYTOCIN 40 UNITS IN LACTATED RINGERS INFUSION - SIMPLE MED
62.5000 mL/h | INTRAVENOUS | Status: DC
  Filled 2014-01-04: qty 1000

## 2014-01-04 MED ORDER — WITCH HAZEL-GLYCERIN EX PADS: 1.0000 "application " | MEDICATED_PAD | CUTANEOUS | Status: DC | PRN

## 2014-01-04 MED ORDER — TERBUTALINE SULFATE 1 MG/ML IJ SOLN: 0.2500 mg | Freq: Once | INTRAMUSCULAR | Status: DC | PRN

## 2014-01-04 MED ORDER — OXYCODONE-ACETAMINOPHEN 5-325 MG PO TABS
1.0000 | ORAL_TABLET | ORAL | Status: DC | PRN
  Administered 2014-01-04 (×2): 1 via ORAL
  Filled 2014-01-04 (×2): qty 1

## 2014-01-04 MED ORDER — TETANUS-DIPHTH-ACELL PERTUSSIS 5-2.5-18.5 LF-MCG/0.5 IM SUSP
0.5000 mL | Freq: Once | INTRAMUSCULAR | Status: AC
  Administered 2014-01-04: 0.5 mL via INTRAMUSCULAR
  Filled 2014-01-04: qty 0.5

## 2014-01-04 NOTE — Anesthesia Postprocedure Evaluation (Signed)
  Anesthesia Post-op Note  Patient: Emma Hughes  Procedure(s) Performed: * No procedures listed *  Patient Location: Mother/Baby  Anesthesia Type:Epidural  Level of Consciousness: awake  Airway and Oxygen Therapy: Patient Spontanous Breathing  Post-op Pain: mild  Post-op Assessment: Patient's Cardiovascular Status Stable and Respiratory Function Stable  Post-op Vital Signs: stable  Last Vitals:  Filed Vitals:   01/04/14 1220  BP: 117/71  Pulse: 84  Temp: 36.4 C  Resp: 18    Complications: No apparent anesthesia complications

## 2014-01-04 NOTE — Anesthesia Preprocedure Evaluation (Signed)
Anesthesia Evaluation  Patient identified by MRN, date of birth, ID band Patient awake    Reviewed: Allergy & Precautions, H&P , NPO status , Patient's Chart, lab work & pertinent test results  Airway Mallampati: II  TM Distance: >3 FB Neck ROM: full    Dental no notable dental hx.    Pulmonary former smoker,    Pulmonary exam normal       Cardiovascular negative cardio ROS      Neuro/Psych    GI/Hepatic negative GI ROS, Neg liver ROS,   Endo/Other  negative endocrine ROS  Renal/GU negative Renal ROS     Musculoskeletal   Abdominal Normal abdominal exam  (+)   Peds  Hematology negative hematology ROS (+)   Anesthesia Other Findings   Reproductive/Obstetrics (+) Pregnancy                             Anesthesia Physical Anesthesia Plan  ASA: II  Anesthesia Plan: Epidural   Post-op Pain Management:    Induction:   Airway Management Planned:   Additional Equipment:   Intra-op Plan:   Post-operative Plan:   Informed Consent: I have reviewed the patients History and Physical, chart, labs and discussed the procedure including the risks, benefits and alternatives for the proposed anesthesia with the patient or authorized representative who has indicated his/her understanding and acceptance.     Plan Discussed with:   Anesthesia Plan Comments:         Anesthesia Quick Evaluation  

## 2014-01-04 NOTE — Anesthesia Procedure Notes (Signed)
Epidural Patient location during procedure: OB Start time: 01/04/2014 4:11 AM End time: 01/04/2014 4:15 AM  Staffing Anesthesiologist: Leilani Able Performed by: anesthesiologist   Preanesthetic Checklist Completed: patient identified, surgical consent, pre-op evaluation, timeout performed, IV checked, risks and benefits discussed and monitors and equipment checked  Epidural Patient position: sitting Prep: site prepped and draped and DuraPrep Patient monitoring: continuous pulse ox and blood pressure Approach: midline Location: L3-L4 Injection technique: LOR air  Needle:  Needle type: Tuohy  Needle gauge: 17 G Needle length: 9 cm and 9 Needle insertion depth: 6 cm Catheter type: closed end flexible Catheter size: 19 Gauge Catheter at skin depth: 11 cm Test dose: negative and Other  Assessment Sensory level: T9 Events: blood not aspirated, injection not painful, no injection resistance, negative IV test and no paresthesia  Additional Notes Reason for block:procedure for pain

## 2014-01-04 NOTE — Lactation Note (Signed)
This note was copied from the chart of Emma Renlee Klingel. Lactation Consultation Note  Patient Name: Emma Hughes [id number] Date: 01/04/2014 Reason for consult: Initial assessment of this mom and baby at 12 hours postpartum. Mom breastfed 2 other babies for 3 months each and states her newborn has been latching well.  LC encouraged frequent STS, cue feedings and hand expression to encourage baby to latch and after feedings for nipple care. Mom encouraged to feed baby 8-12 times/24 hours and with feeding cues. LC encouraged review of Baby and Me pp 9, 14 and 20-25 for STS and BF information. LC provided [employer] brochure and reviewed [facility] services and list of community and web site resources. Baby has already had multiple feedings for 10-45 minutes each and output exceeds minimum for this hour of life.  LATCH score per RN assessment this evening=8.      Maternal Data Has patient been taught Hand Expression?: Yes (mom states she knows how to hand express colostrum/milk) Does the patient have breastfeeding experience prior to this delivery?: Yes  Feeding Feeding Type: Breast Fed Length of feed: 20 min  LATCH Score/Interventions            Most recent LATCH score=8 per RN assessment          Lactation Tools Discussed/Used   STS, cue feedings, hand expression  Consult Status Consult Status: Follow-up Date: 01/05/14 Follow-up type: In-patient    [facility] 01/04/2014, 9:53 PM

## 2014-01-04 NOTE — H&P (Signed)
Emma Hughes is a 28 y.o. female presenting for rupture of membranes 21:00 01/03/14 and contractions.  Maternal Medical History:  Reason for admission: Rupture of membranes.   Contractions: Onset was 6-12 hours ago.   Frequency: regular.   Duration is approximately 60 seconds.   Perceived severity is moderate.    Fetal activity: Perceived fetal activity is normal.   Last perceived fetal movement was within the past 12 hours.    Prenatal complications: no prenatal complications Prenatal Complications - Diabetes: none.    OB History   Grav Para Term Preterm Abortions TAB SAB Ect Mult Living   Past Medical History  Diagnosis Date  . Anxiety   . Migraines    Past Surgical History  Procedure Laterality Date  . Dilation and curettage of uterus     Family History: family history includes Heart disease in her father; Stroke in her mother. Social History:  reports that she has quit smoking. Her smoking use included Cigarettes. She smoked 0.25 packs per day. She has never used smokeless tobacco. She reports that she does not drink alcohol or use illicit drugs.   Prenatal Transfer Tool  Maternal Diabetes: No Genetic Screening: Declined Maternal Ultrasounds/Referrals: Normal Fetal Ultrasounds or other Referrals:  Other: Anatomy scan normal Maternal Substance Abuse:  No Significant Maternal Medications:  None Significant Maternal Lab Results:  Lab values include: Group B Strep negative, Rh negative Other Comments:  None  Review of Systems  Unable to perform ROS Constitutional: Negative.   Eyes: Negative for blurred vision.  Respiratory: Negative for cough.   Cardiovascular: Negative for chest pain.  Gastrointestinal: Negative for heartburn.  Genitourinary: Negative for dysuria.  Musculoskeletal: Negative for myalgias.  Skin: Negative for rash.  Neurological: Negative for dizziness and headaches.  Endo/Heme/Allergies: Does not bruise/bleed easily.   Psychiatric/Behavioral: Negative for depression.  All other systems reviewed and are negative.   Dilation: Lip/rim Effacement (%): 100 Station: 0 Exam by:: kim fields rn Blood pressure 139/94, pulse 113, temperature 99.1 F (37.3 C), temperature source Oral, resp. rate 20, height  (1.575 m), weight 85.276 kg (188 lb), last menstrual period 03/29/2013, SpO2 100.00%. Maternal Exam:  Uterine Assessment: Contraction strength is moderate.  Contraction duration is 60 seconds. Contraction frequency is regular.   Abdomen: Patient reports no abdominal tenderness. Fundal height is 39cm.   Estimated fetal weight is 2800 grams.   Fetal presentation: vertex  Introitus: Normal vulva. Normal vagina.  Ferning test: positive.  Nitrazine test: positive. Amniotic fluid character: clear.  Pelvis: adequate for delivery.   Cervix: On Admission 2 cm  Fetal Exam Fetal Monitor Review: Mode: hand-held doppler probe.   Baseline rate: 135.  Variability: moderate (6-25 bpm).   Pattern: accelerations present and no decelerations.    Fetal State Assessment: Category I - tracings are normal.     Physical Exam  Vitals reviewed. Constitutional: She is oriented to person, place, and time. She appears well-developed and well-nourished.  HENT:  Head: Normocephalic and atraumatic.  Eyes: Pupils are equal, round, and reactive to light.  Neck: Normal range of motion.  Cardiovascular: Normal rate and regular rhythm.   Respiratory: Effort normal.  GI: Soft.  Musculoskeletal: Normal range of motion.  Neurological: She is alert and oriented to person, place, and time.    Prenatal labs: ABO, Rh: --/--/O NEG (09/27 0030) Antibody: POS (09/27 0030) Rubella: Immune (02/05 0000) RPR: Nonreactive (02/05 0000)  HBsAg:  Negative (02/05 0000)  HIV: Non-reactive (02/05 0000)  GBS: Negative (09/02 0000)   Assessment/Plan: [facility] [id number] at 39 weeks 2 days with SROM in early labor  Admit to L&D Epidural on  demand Continuous monitoring Pitocin for augmentation Anticipate NSVD   Torrin Frein STACIA 01/04/2014, 8:57 AM

## 2014-01-05 LAB — CBC
HCT: 29 % — ABNORMAL LOW (ref 36.0–46.0)
Hemoglobin: 9.1 g/dL — ABNORMAL LOW (ref 12.0–15.0)
MCH: 24.9 pg — ABNORMAL LOW (ref 26.0–34.0)
MCHC: 31.4 g/dL (ref 30.0–36.0)
MCV: 79.2 fL (ref 78.0–100.0)
PLATELETS: 220 10*3/uL (ref 150–400)
RBC: 3.66 MIL/uL — ABNORMAL LOW (ref 3.87–5.11)
RDW: 15.4 % (ref 11.5–15.5)
WBC: 15.1 10*3/uL — ABNORMAL HIGH (ref 4.0–10.5)

## 2014-01-05 MED ORDER — FERROUS SULFATE 325 (65 FE) MG PO TABS
325.0000 mg | ORAL_TABLET | Freq: Every day | ORAL | Status: DC
  Administered 2014-01-05: 325 mg via ORAL

## 2014-01-05 MED ORDER — RHO D IMMUNE GLOBULIN 1500 UNIT/2ML IJ SOSY
300.0000 ug | PREFILLED_SYRINGE | Freq: Once | INTRAMUSCULAR | Status: AC
  Administered 2014-01-05: 300 ug via INTRAVENOUS
  Filled 2014-01-05: qty 2

## 2014-01-05 NOTE — Progress Notes (Signed)
Ur chart review completed.  

## 2014-01-05 NOTE — Progress Notes (Addendum)
Clinical Social Work Department PSYCHOSOCIAL ASSESSMENT - MATERNAL/CHILD 01/05/2014  Patient:  Emma Hughes,Emma Hughes  Account Number:  [id number]  Admit Date:  01/03/2014  Childs Name:   Emma Hughes   Clinical Social Worker:  Ayssa Bentivegna, CLINICAL SOCIAL WORKER   Date/Time:  01/05/2014 10:15 AM  Date Referred:  01/04/2014   Referral source  Central Nursery     Referred reason  Depression/Anxiety   Other referral source:    I:  FAMILY / HOME ENVIRONMENT Child's legal guardian:  PARENT  Guardian - Name Guardian - Age Guardian - Address  Emma Hughes 28 [address] , [place] [address]  Emma Hughes  Same as above   Other household support members/support persons Name Relationship DOB   DAUGHTER January 2006   DAUGHTER November 2007   Other support:   MOB stated that the FOB's mother lives in [place] and is supportive.  MOB shared belief that their friends are also supportive.    II  PSYCHOSOCIAL DATA Information Source:  Family Interview  Financial and Community Resources Employment:   MOB stated that she does not work.  FOB is employed with a heating and air conditioning company.   Financial resources:  Medicaid If Medicaid - County:  [place] Other  Food Stamps  WIC   School / Grade:  N/A Maternity Care Coordinator / Child Services Coordination / Early Interventions:   N/A  Cultural issues impacting care:   None reported    III  STRENGTHS Strengths  Supportive family/friends  Home prepared for Child (including basic supplies)  Adequate Resources   Strength comment:    IV  RISK FACTORS AND CURRENT PROBLEMS Current Problem:  YES   Risk Factor & Current Problem Patient Issue Family Issue Risk Factor / Current Problem Comment  Mental Illness Y N MOB presents with mental health history signficiant for anxiety.  MOB denied acute anxiety in 2-3 years, but did endorse some shortness of breath during her pregnancy secondary to anxious thoughts.    V   SOCIAL WORK ASSESSMENT CSW met with the MOB in her room in order to complete the assessment. Consult was ordered due to MOB presenting with a history of anxiety.  MOB provided consent for the FOB to be present in her room for the entire assessment.  MOB and FOB were receptive to the CSW visit, but were not easily engaged as evidenced by providing short/concise answers and processing minimally their feelings as they transitions into the postpartum period.  MOB was observed to display full range in affect and in a pleasant mood.  She smiled frequently and was interacting with the baby.  MOB acknowledged reason for CSW consult but was non-specific about her diagnosis and medication history related to her anxiety.    MOB and FOB expressed excitement upon the birth of their daughter.  FOB expressed excitement as this is his first biological daughter.  CSW normalized increase in anxiety as FOB makes the role adjustment.  FOB smiled, but responded minimally to CSW prompts to further process his anxiety.  MOB and FOB shared belief that they are well supported by their family, friends, and employers.  MOB denied financial stressors, and shard that they have all necessary items for the baby.  MOB stated that she and the FOB were married 1 month ago and purchased a home 3 months ago.  CSW normalized stress associated with multiple life changes in short period of time, but MOB denied stress and shared that all these changes provided "a sense   of relief".  They discussed that they were previously living in a 2 bedroom apartment and they now live in a 3 bedroom home.    MOB reported history of anxiety 2-3 years ago.  MOB was unclear on exact onset, but stated that she was prescribed medications. MOB presented with awareness that panic attacks warranted the medical intervention.  MOB denied recent panic attacks, but was willing to identify her symptoms of anxiety during her pregnancy.  Per MOB, she experienced shortness of  breath throughout her pregnancy when she was home alone and "overthinking" and then randomly when she would begin to worry that something "bad" was going to happen to her two older daughters.  MOB shared that it is helpful for her to talk to the FOB during these moments to self-regulate her emotions.  MOB smiled as she recalled triggers of anxiety as she discussed that she realized that it is better for her to be active/busy to reduce her mind from wandering to anxiety provoking thoughts.  MOB denied any concerns with her anxiety as she prepares to have a newborn in the home again.  MOB also presents with self-awareness on when it would be necessary to return to her doctor to receive medication for anxiety.  MOB is able to verbalize how untreated anxiety would negatively impact her ability to care for the baby.    MOB verbalized understanding of increased risk for developing postpartum depression due to her history of anxiety and recent life stressors.  MOB did endorse history of postpartum depression following the birth of her other daughters, but shared belief that she is more supported now and denied concerns about developing symptoms.   No barriers to discharge.   VI SOCIAL WORK PLAN Social Work Plan  Patient/Family Education  No Further Intervention Required / No Barriers to Discharge   Type of pt/family education:   Postpartum depression and anxiety   If child protective services report - county:   If child protective services report - date:   Information/referral to community resources comment:   No referrals needed at this time.   Other social work plan:   CSW to provide ongoing emotional support PRN.     

## 2014-01-05 NOTE — Lactation Note (Signed)
This note was copied from the chart of Emma Leen Labarre. Lactation Consultation Note  Mother's left nipple has crack at base and mother is sore.  Suggested she apply ebm instead of lanolin and provided mother with comfort gels. Reviewed how to achieve a deeper latch.  Mother latched baby in football with more depth. Demonstrated how to do a chin tug, flange bottom lip and massage to keep her active. Baby latches easily. Mom encouraged to feed baby 8-12 times/24 hours and with feeding cues.     Patient Name: Emma Hughes GNFAO'Z Date: 01/05/2014 Reason for consult: Follow-up assessment   Maternal Data    Feeding Feeding Type: Breast Fed  LATCH Score/Interventions Latch: Grasps breast easily, tongue down, lips flanged, rhythmical sucking.  Audible Swallowing: A few with stimulation  Type of Nipple: Everted at rest and after stimulation  Comfort (Breast/Nipple): Filling, red/small blisters or bruises, mild/mod discomfort  Problem noted: Mild/Moderate discomfort;Cracked, bleeding, blisters, bruises Interventions  (Cracked/bleeding/bruising/blister): Expressed breast milk to nipple Interventions (Mild/moderate discomfort): Comfort gels;Post-pump  Hold (Positioning): No assistance needed to correctly position infant at breast.  LATCH Score: 8  Lactation Tools Discussed/Used     Consult Status Consult Status: PRN    Hardie Pulley 01/05/2014, 11:11 AM

## 2014-01-05 NOTE — Discharge Summary (Signed)
Obstetric Discharge Summary Reason for Admission: rupture of membranes Prenatal Procedures: ultrasound Intrapartum Procedures: spontaneous vaginal delivery Postpartum Procedures: none Complications-Operative and Postpartum: none Hemoglobin  Date Value Ref Range Status  01/05/2014 9.1* 12.0 - 15.0 g/dL Final     HCT  Date Value Ref Range Status  01/05/2014 29.0* 36.0 - 46.0 % Final    Physical Exam:  General: alert and cooperative Lochia: appropriate Uterine Fundus: firm DVT Evaluation: No evidence of DVT seen on physical exam.  Discharge Diagnoses: Term Pregnancy-delivered  Discharge Information: Date: 01/05/2014 Activity: pelvic rest Diet: routine Medications: PNV and Ibuprofen Condition: stable Instructions: refer to practice specific booklet Discharge to: home Follow-up Information   Follow up with PINN, Sanjuana Mae, MD In 4 weeks.   Specialty:  Obstetrics and Gynecology   Contact information:   [address] [place] [address] [phone]       Newborn Data: Live born female  Birth Weight: 6 lb 10.9 oz (3030 g) APGAR: 9, 9  Home with mother.  Emma Hughes 01/05/2014, 1:29 PM

## 2014-01-06 LAB — RH IG WORKUP (INCLUDES ABO/RH)
ABO/RH(D): O NEG
FETAL SCREEN: NEGATIVE
GESTATIONAL AGE(WKS): 39
Unit division: 0

## 2014-01-06 LAB — TYPE AND SCREEN
ABO/RH(D): O NEG
Antibody Screen: POSITIVE
DAT, IgG: NEGATIVE
UNIT DIVISION: 0
Unit division: 0

## 2014-02-09 ENCOUNTER — Encounter (HOSPITAL_COMMUNITY): Payer: Self-pay

## 2015-11-24 ENCOUNTER — Encounter: Payer: Self-pay | Admitting: Neurology

## 2017-04-10 NOTE — L&D Delivery Note (Signed)
Patient was C/C/+1 and pushed for <5 minutes with epidural.    NSVD female infant, Apgars 9/9, weight pending.   The patient had no lacerations. Fundus was firm. EBL was expected amount. Placenta was delivered intact. Vagina was clear.  Delayed cord clamping done for 30-60 seconds while warming baby. Baby was vigorous and doing skin to skin with mother.  Emma Hughes

## 2017-09-10 LAB — OB RESULTS CONSOLE GC/CHLAMYDIA
Chlamydia: NEGATIVE
Gonorrhea: NEGATIVE

## 2017-09-24 LAB — OB RESULTS CONSOLE RPR: RPR: NONREACTIVE

## 2017-09-24 LAB — OB RESULTS CONSOLE HGB/HCT, BLOOD
HCT: 41 (ref 29–41)
Hemoglobin: 13.5

## 2017-09-24 LAB — OB RESULTS CONSOLE PLATELET COUNT: Platelets: 224

## 2017-09-24 LAB — OB RESULTS CONSOLE HIV ANTIBODY (ROUTINE TESTING): HIV: NONREACTIVE

## 2017-09-24 LAB — OB RESULTS CONSOLE HEPATITIS B SURFACE ANTIGEN: Hepatitis B Surface Ag: NEGATIVE

## 2017-09-24 LAB — OB RESULTS CONSOLE RUBELLA ANTIBODY, IGM: Rubella: IMMUNE

## 2017-09-26 LAB — OB RESULTS CONSOLE ANTIBODY SCREEN: Antibody Screen: NEGATIVE

## 2017-10-17 ENCOUNTER — Encounter: Payer: Medicaid Other | Attending: Obstetrics | Admitting: Registered"

## 2017-10-17 DIAGNOSIS — Z3A00 Weeks of gestation of pregnancy not specified: Secondary | ICD-10-CM | POA: Insufficient documentation

## 2017-10-17 DIAGNOSIS — Z713 Dietary counseling and surveillance: Secondary | ICD-10-CM | POA: Diagnosis not present

## 2017-10-17 DIAGNOSIS — O99810 Abnormal glucose complicating pregnancy: Secondary | ICD-10-CM

## 2017-10-19 ENCOUNTER — Encounter: Payer: Self-pay | Admitting: Registered"

## 2017-10-19 DIAGNOSIS — O99810 Abnormal glucose complicating pregnancy: Secondary | ICD-10-CM | POA: Insufficient documentation

## 2017-10-19 NOTE — Progress Notes (Signed)
Patient was seen on 10/17/2017 for Gestational Diabetes self-management class at the [facility]. The following learning objectives were met by the patient during this course:   States the definition of Gestational Diabetes  States why dietary management is important in controlling blood glucose  Describes the effects each nutrient has on blood glucose levels  Demonstrates ability to create a balanced meal plan  Demonstrates carbohydrate counting   States when to check blood glucose levels  Demonstrates proper blood glucose monitoring techniques  States the effect of stress and exercise on blood glucose levels  States the importance of limiting caffeine and abstaining from alcohol and smoking  Blood glucose monitor given: none  Patient instructed to monitor glucose levels: FBS: 60 - <95; 1 hour: <140; 2 hour: <120  Patient received handouts:  Nutrition Diabetes and Pregnancy, including carb counting list  Patient will be seen for follow-up as needed.

## 2018-01-21 LAB — OB RESULTS CONSOLE PLATELET COUNT: Platelets: 85

## 2018-01-21 LAB — OB RESULTS CONSOLE HGB/HCT, BLOOD
HCT: 35 (ref 29–41)
Hemoglobin: 11.4

## 2018-01-21 LAB — OB RESULTS CONSOLE RPR: RPR: NONREACTIVE

## 2018-03-19 LAB — OB RESULTS CONSOLE GBS: GBS: POSITIVE

## 2018-04-05 ENCOUNTER — Other Ambulatory Visit: Payer: Self-pay | Admitting: Obstetrics and Gynecology

## 2018-04-08 ENCOUNTER — Inpatient Hospital Stay (HOSPITAL_COMMUNITY): Payer: Medicaid Other | Admitting: Anesthesiology

## 2018-04-08 ENCOUNTER — Encounter (HOSPITAL_COMMUNITY): Payer: Self-pay

## 2018-04-08 ENCOUNTER — Other Ambulatory Visit: Payer: Self-pay

## 2018-04-08 ENCOUNTER — Inpatient Hospital Stay (HOSPITAL_COMMUNITY)
Admission: AD | Admit: 2018-04-08 | Discharge: 2018-04-10 | DRG: 807 | Disposition: A | Discharge status: Home / Self Care | Payer: Medicaid Other | Attending: Obstetrics and Gynecology | Admitting: Obstetrics and Gynecology

## 2018-04-08 DIAGNOSIS — Z87891 Personal history of nicotine dependence: Secondary | ICD-10-CM | POA: Diagnosis not present

## 2018-04-08 DIAGNOSIS — O24425 Gestational diabetes mellitus in childbirth, controlled by oral hypoglycemic drugs: Secondary | ICD-10-CM | POA: Diagnosis present

## 2018-04-08 DIAGNOSIS — O99824 Streptococcus B carrier state complicating childbirth: Secondary | ICD-10-CM | POA: Diagnosis present

## 2018-04-08 DIAGNOSIS — Z3A39 39 weeks gestation of pregnancy: Secondary | ICD-10-CM | POA: Diagnosis not present

## 2018-04-08 DIAGNOSIS — O24419 Gestational diabetes mellitus in pregnancy, unspecified control: Secondary | ICD-10-CM | POA: Diagnosis present

## 2018-04-08 LAB — CBC
HCT: 34.7 % — ABNORMAL LOW (ref 36.0–46.0)
Hemoglobin: 11 g/dL — ABNORMAL LOW (ref 12.0–15.0)
MCH: 25.8 pg — ABNORMAL LOW (ref 26.0–34.0)
MCHC: 31.7 g/dL (ref 30.0–36.0)
MCV: 81.5 fL (ref 80.0–100.0)
Platelets: 278 10*3/uL (ref 150–400)
RBC: 4.26 MIL/uL (ref 3.87–5.11)
RDW: 15.3 % (ref 11.5–15.5)
WBC: 11 10*3/uL — ABNORMAL HIGH (ref 4.0–10.5)
nRBC: 0 % (ref 0.0–0.2)

## 2018-04-08 LAB — GLUCOSE, CAPILLARY
GLUCOSE-CAPILLARY: 82 mg/dL (ref 70–99)
GLUCOSE-CAPILLARY: 65 mg/dL — AB (ref 70–99)
Glucose-Capillary: 70 mg/dL (ref 70–99)

## 2018-04-08 LAB — RPR: RPR Ser Ql: NONREACTIVE

## 2018-04-08 MED ORDER — EPHEDRINE 5 MG/ML INJ
10.0000 mg | INTRAVENOUS | Status: DC | PRN
  Filled 2018-04-08: qty 2

## 2018-04-08 MED ORDER — OXYTOCIN 40 UNITS IN LACTATED RINGERS INFUSION - SIMPLE MED
2.5000 [IU]/h | INTRAVENOUS | Status: DC
  Filled 2018-04-08: qty 1000

## 2018-04-08 MED ORDER — DIPHENHYDRAMINE HCL 50 MG/ML IJ SOLN: 12.5000 mg | INTRAMUSCULAR | Status: DC | PRN

## 2018-04-08 MED ORDER — TETANUS-DIPHTH-ACELL PERTUSSIS 5-2.5-18.5 LF-MCG/0.5 IM SUSP: 0.5000 mL | Freq: Once | INTRAMUSCULAR | Status: DC

## 2018-04-08 MED ORDER — DIBUCAINE 1 % RE OINT
1.0000 "application " | TOPICAL_OINTMENT | RECTAL | Status: DC | PRN
  Administered 2018-04-08: 1 via RECTAL
  Filled 2018-04-08: qty 28

## 2018-04-08 MED ORDER — OXYCODONE-ACETAMINOPHEN 5-325 MG PO TABS
1.0000 | ORAL_TABLET | ORAL | Status: DC | PRN
  Administered 2018-04-08 – 2018-04-09 (×3): 1 via ORAL
  Filled 2018-04-08 (×3): qty 1

## 2018-04-08 MED ORDER — TERBUTALINE SULFATE 1 MG/ML IJ SOLN: 0.2500 mg | Freq: Once | INTRAMUSCULAR | Status: DC | PRN

## 2018-04-08 MED ORDER — ONDANSETRON HCL 4 MG/2ML IJ SOLN
4.0000 mg | Freq: Four times a day (QID) | INTRAMUSCULAR | Status: DC | PRN
  Administered 2018-04-08: 4 mg via INTRAVENOUS
  Filled 2018-04-08: qty 2

## 2018-04-08 MED ORDER — FENTANYL 2.5 MCG/ML BUPIVACAINE 1/10 % EPIDURAL INFUSION (WH - ANES)
14.0000 mL/h | INTRAMUSCULAR | Status: DC | PRN
  Administered 2018-04-08: 14 mL/h via EPIDURAL
  Filled 2018-04-08: qty 100

## 2018-04-08 MED ORDER — SIMETHICONE 80 MG PO CHEW: 80.0000 mg | CHEWABLE_TABLET | ORAL | Status: DC | PRN

## 2018-04-08 MED ORDER — SENNOSIDES-DOCUSATE SODIUM 8.6-50 MG PO TABS
2.0000 | ORAL_TABLET | ORAL | Status: DC
  Administered 2018-04-09 (×2): 2 via ORAL
  Filled 2018-04-08 (×2): qty 2

## 2018-04-08 MED ORDER — LACTATED RINGERS IV SOLN: 500.0000 mL | Freq: Once | INTRAVENOUS | Status: DC

## 2018-04-08 MED ORDER — DIPHENHYDRAMINE HCL 25 MG PO CAPS: 25.0000 mg | ORAL_CAPSULE | Freq: Four times a day (QID) | ORAL | Status: DC | PRN

## 2018-04-08 MED ORDER — LACTATED RINGERS IV SOLN: 500.0000 mL | INTRAVENOUS | Status: DC | PRN

## 2018-04-08 MED ORDER — OXYTOCIN 40 UNITS IN LACTATED RINGERS INFUSION - SIMPLE MED
1.0000 m[IU]/min | INTRAVENOUS | Status: DC
  Administered 2018-04-08: 2 m[IU]/min via INTRAVENOUS

## 2018-04-08 MED ORDER — ACETAMINOPHEN 325 MG PO TABS
650.0000 mg | ORAL_TABLET | ORAL | Status: DC | PRN
  Administered 2018-04-08: 650 mg via ORAL
  Filled 2018-04-08: qty 2

## 2018-04-08 MED ORDER — TERBUTALINE SULFATE 1 MG/ML IJ SOLN
0.2500 mg | Freq: Once | INTRAMUSCULAR | Status: DC | PRN
  Filled 2018-04-08: qty 1

## 2018-04-08 MED ORDER — PHENYLEPHRINE 40 MCG/ML (10ML) SYRINGE FOR IV PUSH (FOR BLOOD PRESSURE SUPPORT)
80.0000 ug | PREFILLED_SYRINGE | INTRAVENOUS | Status: DC | PRN
  Filled 2018-04-08: qty 10

## 2018-04-08 MED ORDER — MISOPROSTOL 25 MCG QUARTER TABLET
25.0000 ug | ORAL_TABLET | ORAL | Status: DC | PRN
  Administered 2018-04-08 (×2): 25 ug via VAGINAL
  Filled 2018-04-08 (×4): qty 1

## 2018-04-08 MED ORDER — OXYCODONE-ACETAMINOPHEN 5-325 MG PO TABS
2.0000 | ORAL_TABLET | ORAL | Status: DC | PRN
  Administered 2018-04-09 – 2018-04-10 (×2): 2 via ORAL
  Filled 2018-04-08 (×2): qty 2

## 2018-04-08 MED ORDER — ONDANSETRON HCL 4 MG/2ML IJ SOLN: 4.0000 mg | INTRAMUSCULAR | Status: DC | PRN

## 2018-04-08 MED ORDER — BENZOCAINE-MENTHOL 20-0.5 % EX AERO
1.0000 "application " | INHALATION_SPRAY | CUTANEOUS | Status: DC | PRN
  Administered 2018-04-08: 1 via TOPICAL
  Filled 2018-04-08: qty 56

## 2018-04-08 MED ORDER — OXYTOCIN BOLUS FROM INFUSION
500.0000 mL | Freq: Once | INTRAVENOUS | Status: AC
  Administered 2018-04-08: 500 mL via INTRAVENOUS

## 2018-04-08 MED ORDER — LIDOCAINE HCL (PF) 1 % IJ SOLN
30.0000 mL | INTRAMUSCULAR | Status: DC | PRN
  Filled 2018-04-08: qty 30

## 2018-04-08 MED ORDER — LACTATED RINGERS IV SOLN
INTRAVENOUS | Status: DC
  Administered 2018-04-08 (×2): via INTRAVENOUS

## 2018-04-08 MED ORDER — ONDANSETRON HCL 4 MG PO TABS: 4.0000 mg | ORAL_TABLET | ORAL | Status: DC | PRN

## 2018-04-08 MED ORDER — IBUPROFEN 600 MG PO TABS
600.0000 mg | ORAL_TABLET | Freq: Four times a day (QID) | ORAL | Status: DC
  Administered 2018-04-08 – 2018-04-10 (×8): 600 mg via ORAL
  Filled 2018-04-08 (×8): qty 1

## 2018-04-08 MED ORDER — CLINDAMYCIN PHOSPHATE 900 MG/50ML IV SOLN
900.0000 mg | Freq: Three times a day (TID) | INTRAVENOUS | Status: DC
  Administered 2018-04-08 (×2): 900 mg via INTRAVENOUS
  Filled 2018-04-08 (×4): qty 50

## 2018-04-08 MED ORDER — SOD CITRATE-CITRIC ACID 500-334 MG/5ML PO SOLN: 30.0000 mL | ORAL | Status: DC | PRN

## 2018-04-08 MED ORDER — PRENATAL MULTIVITAMIN CH
1.0000 | ORAL_TABLET | Freq: Every day | ORAL | Status: DC
  Administered 2018-04-09 – 2018-04-10 (×2): 1 via ORAL
  Filled 2018-04-08 (×2): qty 1

## 2018-04-08 MED ORDER — COCONUT OIL OIL: 1.0000 "application " | TOPICAL_OIL | Status: DC | PRN

## 2018-04-08 MED ORDER — ZOLPIDEM TARTRATE 5 MG PO TABS: 5.0000 mg | ORAL_TABLET | Freq: Every evening | ORAL | Status: DC | PRN

## 2018-04-08 MED ORDER — BUTORPHANOL TARTRATE 1 MG/ML IJ SOLN: 1.0000 mg | INTRAMUSCULAR | Status: DC | PRN

## 2018-04-08 MED ORDER — LIDOCAINE HCL (PF) 1 % IJ SOLN
INTRAMUSCULAR | Status: DC | PRN
  Administered 2018-04-08: 5 mL via EPIDURAL

## 2018-04-08 MED ORDER — WITCH HAZEL-GLYCERIN EX PADS
1.0000 "application " | MEDICATED_PAD | CUTANEOUS | Status: DC | PRN
  Administered 2018-04-08: 1 via TOPICAL

## 2018-04-08 MED ORDER — PHENYLEPHRINE 40 MCG/ML (10ML) SYRINGE FOR IV PUSH (FOR BLOOD PRESSURE SUPPORT)
80.0000 ug | PREFILLED_SYRINGE | INTRAVENOUS | Status: DC | PRN
  Filled 2018-04-08 (×2): qty 10

## 2018-04-08 NOTE — Lactation Note (Addendum)
This note was copied from a baby's chart. Lactation Consultation Note  Patient Name: Emma Hughes  SVD with epiduraL of G7 P4 baby Emma [place] at 6460w2d gestation now 7 hours old. Mom reports Breastfeeding did not go well with her other babies.  Mom reports that she tried to breastfeed each one for about 3 months but her nipples stayed cracked and bleeding and painful.  Mom with anxiety, GDM.  Assist with breastfeeding on both breasts infant breastfeeds better on the right breast.  Mom able to latch and postion him on right with minimal assist.  However he will not atch well to the left at this time,  Showed mom how to prepump her nipples to help him latch.  Repeated attempts on left breast.Mom reported comfort on right breast and nipple was round and elongated when he came off and mom reported comfort.  Mom inquired about a nipple shield.  Reports she was told to try one.  Urged her to give him a little bit longer to try and feed.  Explained we like to give them a good 24 hours to try and figure it out first.  Infant with slight recessed jaw.  Urged mom to hand express and spoon feed back all expressed mothers milk past breastfeedings.  Gave mom Breats shells and encouraged her to wear them to help nipples evert but not when sleeping. Urged mom to follow up with lactation as needed  Maternal Data    Feeding Feeding Type: Breast Fed  [facility] Score                   Interventions    Lactation Tools Discussed/Used     Consult Status      Emma Hughes Hughes, 9:58 PM

## 2018-04-08 NOTE — Anesthesia Postprocedure Evaluation (Signed)
Anesthesia Post Note  Patient: Emma Hughes  Procedure(s) Performed: AN AD HOC LABOR EPIDURAL     Patient location during evaluation: Mother Baby Anesthesia Type: Epidural Level of consciousness: awake and alert Pain management: pain level controlled Vital Signs Assessment: post-procedure vital signs reviewed and stable Respiratory status: spontaneous breathing, nonlabored ventilation and respiratory function stable Cardiovascular status: stable Postop Assessment: no headache, no backache and epidural receding Anesthetic complications: no    Last Vitals:  Vitals:   04/08/18 1500 04/08/18 1600  BP: 119/70 120/68  Pulse: 69 71  Resp: 20 18  Temp: 36.7 C 36.7 C  SpO2:  98%    Last Pain:  Vitals:   04/08/18 1600  TempSrc:   PainSc: 0-No pain   Pain Goal:                 EchoStarMERRITT,Teigen Bellin

## 2018-04-08 NOTE — Anesthesia Preprocedure Evaluation (Addendum)
Anesthesia Evaluation  Patient identified by MRN, date of birth, ID band Patient awake    Reviewed: Allergy & Precautions, H&P , NPO status , Patient's Chart, lab work & pertinent test results  Airway Mallampati: II  TM Distance: >3 FB Neck ROM: Full    Dental no notable dental hx. (+) Teeth Intact   Pulmonary neg pulmonary ROS, former smoker,    Pulmonary exam normal breath sounds clear to auscultation       Cardiovascular negative cardio ROS Normal cardiovascular exam Rhythm:Regular Rate:Normal     Neuro/Psych  Headaches, negative psych ROS   GI/Hepatic negative GI ROS, Neg liver ROS,   Endo/Other  diabetes, Gestational  Renal/GU negative Renal ROS     Musculoskeletal negative musculoskeletal ROS (+)   Abdominal (+) + obese,   Peds  Hematology negative hematology ROS (+)   Anesthesia Other Findings   Reproductive/Obstetrics (+) Pregnancy                            Lab Results  Component Value Date   WBC 11.0 (H) 04/08/2018   HGB 11.0 (L) 04/08/2018   HCT 34.7 (L) 04/08/2018   MCV 81.5 04/08/2018   PLT 278 04/08/2018    Anesthesia Physical Anesthesia Plan  ASA: III  Anesthesia Plan: Epidural   Post-op Pain Management:    Induction:   PONV Risk Score and Plan:   Airway Management Planned:   Additional Equipment:   Intra-op Plan:   Post-operative Plan:   Informed Consent: I have reviewed the patients History and Physical, chart, labs and discussed the procedure including the risks, benefits and alternatives for the proposed anesthesia with the patient or authorized representative who has indicated his/her understanding and acceptance.     Plan Discussed with:   Anesthesia Plan Comments:         Anesthesia Quick Evaluation

## 2018-04-08 NOTE — Anesthesia Procedure Notes (Signed)
Epidural Patient location during procedure: OB Start time: 04/08/2018 8:38 AM End time: 04/08/2018 8:52 AM  Staffing Anesthesiologist: Trevor IhaHouser, Matheson Vandehei A, MD Performed: anesthesiologist   Preanesthetic Checklist Completed: patient identified, site marked, surgical consent, pre-op evaluation, timeout performed, IV checked, risks and benefits discussed and monitors and equipment checked  Epidural Patient position: sitting Prep: site prepped and draped and DuraPrep Patient monitoring: continuous pulse ox and blood pressure Approach: midline Location: L3-L4 Injection technique: LOR air  Needle:  Needle type: Tuohy  Needle gauge: 17 G Needle length: 9 cm and 9 Needle insertion depth: 5 cm cm Catheter type: closed end flexible Catheter size: 19 Gauge Catheter at skin depth: 10 cm Test dose: negative  Assessment Events: blood not aspirated, injection not painful, no injection resistance, negative IV test and no paresthesia  Additional Notes Patient identified. Risks/Benefits/Options discussed with patient including but not limited to bleeding, infection, nerve damage, paralysis, failed block, incomplete pain control, headache, blood pressure changes, nausea, vomiting, reactions to medication both or allergic, itching and postpartum back pain. Confirmed with bedside nurse the patient's most recent platelet count. Confirmed with patient that they are not currently taking any anticoagulation, have any bleeding history or any family history of bleeding disorders. Patient expressed understanding and wished to proceed. All questions were answered. Sterile technique was used throughout the entire procedure. Please see nursing notes for vital signs. Test dose was given through epidural needle and negative prior to continuing to dose epidural or start infusion. Warning signs of high block given to the patient including shortness of breath, tingling/numbness in hands, complete motor block, or any  concerning symptoms with instructions to call for help. Patient was given instructions on fall risk and not to get out of bed. All questions and concerns addressed with instructions to call with any issues. 2 Attempt (S) . Patient tolerated procedure well.

## 2018-04-08 NOTE — H&P (Signed)
32 y.o. 160w2d  [id number] comes in for scheduled IOL for GDMA2.  Otherwise has good fetal movement and no bleeding.  Past Medical History:  Diagnosis Date  . Anxiety   . Migraines     Past Surgical History:  Procedure Laterality Date  . DILATION AND CURETTAGE OF UTERUS      OB History  Gravida Para Term Preterm AB Living  7 3 3   3 3   SAB TAB Ectopic Multiple Live Births  3       3    # Outcome Date GA Lbr Len/2nd Weight Sex Delivery Anes PTL Lv  7 Current           6 SAB 04/10/16          5 SAB 12/10/14          4 Term 01/04/14 6663w3d 10:42 / 00:14 3030 g F Vag-Spont EPI  LIV  3 SAB 2014          2 Term 03/06/06     Vag-Spont   LIV  1 Term 05/10/04     Vag-Spont   LIV    Social History   Socioeconomic History  . Marital status: Married    Spouse name: Not on file  . Number of children: Not on file  . Years of education: Not on file  . Highest education level: Not on file  Occupational History  . Not on file  Social Needs  . Financial resource strain: Not on file  . Food insecurity:    Worry: Not on file    Inability: Not on file  . Transportation needs:    Medical: Not on file    Non-medical: Not on file  Tobacco Use  . Smoking status: Former Smoker    Packs/day: 0.25    Types: Cigarettes  . Smokeless tobacco: Never Used  Substance and Sexual Activity  . Alcohol use: No    Comment: monthly, pina colada  . Drug use: No  . Sexual activity: Yes    Birth control/protection: None  Lifestyle  . Physical activity:    Days per week: Not on file    Minutes per session: Not on file  . Stress: Not on file  Relationships  . Social connections:    Talks on phone: Not on file    Gets together: Not on file    Attends religious service: Not on file    Active member of club or organization: Not on file    Attends meetings of clubs or organizations: Not on file    Relationship status: Not on file  . Intimate partner violence:    Fear of current or ex partner: Not on  file    Emotionally abused: Not on file    Physically abused: Not on file    Forced sexual activity: Not on file  Other Topics Concern  . Not on file  Social History Narrative  . Not on file   Penicillins    Prenatal Transfer Tool  Maternal Diabetes: Yes:  Diabetes Type:  Insulin/Medication controlled Genetic Screening: Normal Maternal Ultrasounds/Referrals: Normal Fetal Ultrasounds or other Referrals:  None Maternal Substance Abuse:  No Significant Maternal Medications:  Meds include: Prozac Significant Maternal Lab Results: Lab values include: Group B Strep positive  Other PNC: GDMA2, depression    Vitals:   04/08/18 0912 04/08/18 0916 04/08/18 0921 04/08/18 0925  BP: 112/75 109/66 109/71 115/69  Pulse: 68 69 77 67  Resp: 18 18 18  18  Temp:      TempSrc:      SpO2:  97%  96%  Weight:      Height:        Lungs/Cor:  NAD Abdomen:  soft, gravid Ex:  no cords, erythema SVE:  3/60/B FHTs:  135, good STV, NST R; Cat 1 tracing. Toco:  q 3-7    A/P   Admitted for IOL for GDMA2 Clindamycin for GBS+  S/p cytotec, will proceed with pitocin and AROM when possible Other routine care  Philip AspenSidney Oluwatobiloba Martin

## 2018-04-08 NOTE — Anesthesia Pain Management Evaluation Note (Signed)
  CRNA Pain Management Visit Note  Patient: Emma Hughes, 32 y.o., female  "Hello I am a member of the anesthesia team at [facility]. We have an anesthesia team available at all times to provide care throughout the hospital, including epidural management and anesthesia for C-section. I don't know your plan for the delivery whether it a natural birth, water birth, IV sedation, nitrous supplementation, doula or epidural, but we want to meet your pain goals."   1.Was your pain managed to your expectations on prior hospitalizations?   Yes   2.What is your expectation for pain management during this hospitalization?     Epidural  3.How can we help you reach that goal? Epidural at the appropriate time  Record the patient's initial score and the patient's pain goal.   Pain: 4  Pain Goal: 8 [facility] wants you to be able to say your pain was always managed very well.  Ulice BoldDavid Adedayo [facility] 04/08/2018

## 2018-04-09 LAB — CBC
HCT: 31.3 % — ABNORMAL LOW (ref 36.0–46.0)
Hemoglobin: 9.7 g/dL — ABNORMAL LOW (ref 12.0–15.0)
MCH: 25.5 pg — ABNORMAL LOW (ref 26.0–34.0)
MCHC: 31 g/dL (ref 30.0–36.0)
MCV: 82.4 fL (ref 80.0–100.0)
Platelets: 235 10*3/uL (ref 150–400)
RBC: 3.8 MIL/uL — ABNORMAL LOW (ref 3.87–5.11)
RDW: 15.5 % (ref 11.5–15.5)
WBC: 15.2 10*3/uL — ABNORMAL HIGH (ref 4.0–10.5)
nRBC: 0 % (ref 0.0–0.2)

## 2018-04-09 MED ORDER — FERROUS SULFATE 325 (65 FE) MG PO TABS
325.0000 mg | ORAL_TABLET | Freq: Every day | ORAL | Status: DC
  Administered 2018-04-09 – 2018-04-10 (×2): 325 mg via ORAL
  Filled 2018-04-09 (×2): qty 1

## 2018-04-09 MED ORDER — RHO D IMMUNE GLOBULIN 1500 UNIT/2ML IJ SOSY
300.0000 ug | PREFILLED_SYRINGE | Freq: Once | INTRAMUSCULAR | Status: AC
  Administered 2018-04-09: 300 ug via INTRAVENOUS
  Filled 2018-04-09: qty 2

## 2018-04-09 NOTE — Progress Notes (Signed)
Patient is eating, ambulating, voiding.  Pain control is good.  Vitals:   04/08/18 1600 04/08/18 2009 04/09/18 0015 04/09/18 0400  BP: 120/68 100/63 110/68 117/62  Pulse: 71 70 72 73  Resp: 18 18 18 18   Temp: 98 F (36.7 C) 98.1 F (36.7 C) 98.3 F (36.8 C) 98.1 F (36.7 C)  TempSrc:  Oral Oral Oral  SpO2: 98%     Weight:      Height:        Fundus firm Perineum without swelling.  Lab Results  Component Value Date   WBC 15.2 (H) 04/09/2018   HGB 9.7 (L) 04/09/2018   HCT 31.3 (L) 04/09/2018   MCV 82.4 04/09/2018   PLT 235 04/09/2018    --/--/O NEG (12/30 0100)/RI  A/P Post partum day 1.  Routine care.  Expect d/c routine.  Rhogam- baby is RH POS. Desire circ but not paid hospital yet. Iron. Loney LaurenceMichelle A Sherley Leser

## 2018-04-09 NOTE — Lactation Note (Signed)
This note was copied from a baby's chart. Lactation Consultation Note  Patient Name: Emma Hughes OZHYQ'MToday's Date: 04/09/2018 Reason for consult: Follow-up assessment;Difficult latch;Term  I visited patient to assess progress with breast feeding and assist with latch. Baby was showing feeding cues in the room. I instructed her to do breast massage and hand expression on her left breast. This breast is more challenging in terms of establishing latch. Colostrum noted. I undressed baby so patient could feed him skin to skin.  I helped position baby in cross cradle hold on the left breast. I instructed patient to do a "U" hold on her left breast. Initially he would open wide, but he would not grasp breast. I offered to let him suckle on my gloved finger. I allowed him to do so until his suck became organized and rhythmic. I then positioned him back on the breast and showed patient how to do a teacup hold to latch baby. He latched with rhythmic suckling sequences with this additional support. Once he settled, we were able to remove this support.  I answered questions regarding the patient's breast shells (how and why). I indicated that she should hand express and possibly pre-pump to help milk flow and to further evert nipples prior to latching. She may use the shells at her discretion.  Patient asked if she needs a nipple shield. At this stage, I did not think this was warranted.  Baby fed for 10 minutes and released breast. Nipple appeared in tact and round.  She burped baby and he began to root. I observed her latch him on her right breast. She appeared more comfortable latching him on this breast. I provide support while feeding.  All questions answered at this time. I educated on day two infant feeding patterns and output expectations.   Maternal Data Has patient been taught Hand Expression?: Yes Does the patient have breastfeeding experience prior to this delivery?: Yes  Feeding Feeding  Type: Breast Fed  LATCH Score Latch: Repeated attempts needed to sustain latch, nipple held in mouth throughout feeding, stimulation needed to elicit sucking reflex.  Audible Swallowing: A few with stimulation  Type of Nipple: Everted at rest and after stimulation  Comfort (Breast/Nipple): Soft / non-tender  Hold (Positioning): Assistance needed to correctly position infant at breast and maintain latch.  LATCH Score: 7  Interventions Interventions: Breast feeding basics reviewed;Assisted with latch;Skin to skin;Breast massage;Pre-pump if needed;Breast compression;Hand pump;Position options;Support pillows;Adjust position  Lactation Tools Discussed/Used Tools: Pump;Shells Breast pump type: Manual   Consult Status Consult Status: Follow-up Date: 04/10/18 Follow-up type: Call as needed    Walker ShadowHeather Wilbert Hayashi 04/09/2018, 5:26 PM

## 2018-04-09 NOTE — Progress Notes (Signed)
MOB was referred for history of depression/anxiety. * Referral screened out by Clinical Social Worker because none of the following criteria appear to apply: ~ History of anxiety/depression during this pregnancy, or of post-partum depression following prior delivery. ~ Diagnosis of anxiety and/or depression within last 3 years OR * MOB's symptoms currently being treated with medication and/or therapy. Please contact the Clinical Social Worker if needs arise, by MOB request, or if MOB scores greater than 9/yes to question 10 on Edinburgh Postpartum Depression Screen.  Georgina Krist Boyd-Gilyard, MSW, LCSW Clinical Social Work [phone]  

## 2018-04-10 LAB — RH IG WORKUP (INCLUDES ABO/RH)
ABO/RH(D): O NEG
Fetal Screen: NEGATIVE
Gestational Age(Wks): 39.2
Unit division: 0

## 2018-04-10 MED ORDER — IBUPROFEN 600 MG PO TABS: 600.0000 mg | ORAL_TABLET | Freq: Four times a day (QID) | ORAL | 0 refills | Status: DC

## 2018-04-10 MED ORDER — FERROUS SULFATE 325 (65 FE) MG PO TABS: 325.0000 mg | ORAL_TABLET | Freq: Every day | ORAL | 3 refills | Status: DC

## 2018-04-10 NOTE — Discharge Summary (Signed)
Obstetric Discharge Summary Reason for Admission: induction of labor Prenatal Procedures: NST and ultrasound Intrapartum Procedures: spontaneous vaginal delivery Postpartum Procedures: none Complications-Operative and Postpartum: none Hemoglobin  Date Value Ref Range Status  04/09/2018 9.7 (L) 12.0 - 15.0 g/dL Final  09/81/1914 78.2  Final   HCT  Date Value Ref Range Status  04/09/2018 31.3 (L) 36.0 - 46.0 % Final  01/21/2018 35 29 - 41 Final    Physical Exam:  General: alert and cooperative Lochia: appropriate   Discharge Diagnoses: Term Pregnancy-delivered and gestational diabetes  Discharge Information: Date: 04/10/2018 Activity: pelvic rest Diet: routine Medications: PNV, Ibuprofen and Iron Condition: stable Instructions: refer to practice specific booklet Discharge to: home   Newborn Data: Live born female  Birth Weight: 6 lb 10.7 oz (3025 g) APGAR: 9, 9  Newborn Delivery   Birth date/time:  04/08/2018 13:20:00 Delivery type:  Vaginal, Spontaneous     Home with mother.  ANDERSON,MARK E 04/10/2018, 1:42 PM

## 2018-04-10 NOTE — Lactation Note (Signed)
This note was copied from a baby's chart. Lactation Consultation Note: Follow up visit. Mom reports they had a rough night- that baby was frustrated and she was frustrated. Reports she fed formula about 6 am. Baby asleep in bassinet at present. Encouraged to call for assist when baby wakes for next feeding. Encouragement given. No questions at present.   Patient Name: Emma Hughes [id number] Date: 04/10/2018 Reason for consult: Follow-up assessment Type of Endocrine Disorder?: Diabetes   Maternal Data Formula Feeding for Exclusion: No Has patient been taught Hand Expression?: Yes Does the patient have breastfeeding experience prior to this delivery?: Yes  Feeding Feeding Type: Bottle Fed - Formula Nipple Type: Slow - flow  LATCH Score                   Interventions    Lactation Tools Discussed/Used     Consult Status Consult Status: Follow-up Date: 04/10/18 Follow-up type: In-patient    Pamelia Hoit 04/10/2018, 7:47 AM

## 2018-04-10 NOTE — Progress Notes (Signed)
PPD#2 Pt without complaints. REady for discharge. VSSAF IMP/ Stable Plan/ Will discharge

## 2018-04-12 LAB — BPAM RBC
Blood Product Expiration Date: 202001282359
Blood Product Expiration Date: 202001282359
Unit Type and Rh: 9500
Unit Type and Rh: 9500

## 2018-04-12 LAB — TYPE AND SCREEN
ABO/RH(D): O NEG
Antibody Screen: POSITIVE
Unit division: 0
Unit division: 0

## 2018-09-20 ENCOUNTER — Encounter (HOSPITAL_COMMUNITY): Payer: Self-pay | Admitting: *Deleted

## 2018-09-20 ENCOUNTER — Inpatient Hospital Stay (HOSPITAL_COMMUNITY)
Admission: AD | Admit: 2018-09-20 | Discharge: 2018-09-20 | Disposition: A | Discharge status: Home / Self Care | Payer: Medicaid Other | Attending: Obstetrics and Gynecology | Admitting: Obstetrics and Gynecology

## 2018-09-20 ENCOUNTER — Other Ambulatory Visit: Payer: Self-pay

## 2018-09-20 ENCOUNTER — Inpatient Hospital Stay (HOSPITAL_COMMUNITY): Payer: Medicaid Other

## 2018-09-20 DIAGNOSIS — Z87891 Personal history of nicotine dependence: Secondary | ICD-10-CM | POA: Diagnosis not present

## 2018-09-20 DIAGNOSIS — Z6791 Unspecified blood type, Rh negative: Secondary | ICD-10-CM

## 2018-09-20 DIAGNOSIS — Z880 Allergy status to penicillin: Secondary | ICD-10-CM | POA: Insufficient documentation

## 2018-09-20 DIAGNOSIS — O200 Threatened abortion: Secondary | ICD-10-CM | POA: Diagnosis not present

## 2018-09-20 DIAGNOSIS — O209 Hemorrhage in early pregnancy, unspecified: Secondary | ICD-10-CM

## 2018-09-20 DIAGNOSIS — Z3490 Encounter for supervision of normal pregnancy, unspecified, unspecified trimester: Secondary | ICD-10-CM

## 2018-09-20 DIAGNOSIS — Z3A01 Less than 8 weeks gestation of pregnancy: Secondary | ICD-10-CM | POA: Diagnosis not present

## 2018-09-20 HISTORY — DX: Unspecified infectious disease: B99.9

## 2018-09-20 HISTORY — DX: Depression, unspecified: F32.A

## 2018-09-20 HISTORY — DX: Anemia, unspecified: D64.9

## 2018-09-20 HISTORY — DX: Gestational diabetes mellitus in pregnancy, unspecified control: O24.419

## 2018-09-20 LAB — CBC WITH DIFFERENTIAL/PLATELET
Abs Immature Granulocytes: 0.02 10*3/uL (ref 0.00–0.07)
Basophils Absolute: 0 10*3/uL (ref 0.0–0.1)
Basophils Relative: 0 %
Eosinophils Absolute: 0.1 10*3/uL (ref 0.0–0.5)
Eosinophils Relative: 1 %
HCT: 37 % (ref 36.0–46.0)
Hemoglobin: 11.8 g/dL — ABNORMAL LOW (ref 12.0–15.0)
Immature Granulocytes: 0 %
Lymphocytes Relative: 31 %
Lymphs Abs: 2.7 10*3/uL (ref 0.7–4.0)
MCH: 27.1 pg (ref 26.0–34.0)
MCHC: 31.9 g/dL (ref 30.0–36.0)
MCV: 84.9 fL (ref 80.0–100.0)
Monocytes Absolute: 0.9 10*3/uL (ref 0.1–1.0)
Monocytes Relative: 11 %
Neutro Abs: 4.9 10*3/uL (ref 1.7–7.7)
Neutrophils Relative %: 57 %
Platelets: 208 10*3/uL (ref 150–400)
RBC: 4.36 MIL/uL (ref 3.87–5.11)
RDW: 15.2 % (ref 11.5–15.5)
WBC: 8.7 10*3/uL (ref 4.0–10.5)
nRBC: 0 % (ref 0.0–0.2)

## 2018-09-20 LAB — COMPREHENSIVE METABOLIC PANEL
ALT: 24 U/L (ref 0–44)
AST: 22 U/L (ref 15–41)
Albumin: 3.1 g/dL — ABNORMAL LOW (ref 3.5–5.0)
Alkaline Phosphatase: 75 U/L (ref 38–126)
Anion gap: 7 (ref 5–15)
BUN: 12 mg/dL (ref 6–20)
CO2: 24 mmol/L (ref 22–32)
Calcium: 8.8 mg/dL — ABNORMAL LOW (ref 8.9–10.3)
Chloride: 107 mmol/L (ref 98–111)
Creatinine, Ser: 0.78 mg/dL (ref 0.44–1.00)
GFR calc Af Amer: >60 mL/min (ref >60)
GFR calc non Af Amer: >60 mL/min (ref >60)
Glucose, Bld: 93 mg/dL (ref 70–99)
Potassium: 4.2 mmol/L (ref 3.5–5.1)
Sodium: 138 mmol/L (ref 135–145)
Total Bilirubin: 0.3 mg/dL (ref 0.3–1.2)
Total Protein: 6.1 g/dL — ABNORMAL LOW (ref 6.5–8.1)

## 2018-09-20 LAB — HCG, QUANTITATIVE, PREGNANCY: hCG, Beta Chain, Quant, S: 654 m[IU]/mL — ABNORMAL HIGH (ref <5)

## 2018-09-20 MED ORDER — RHO D IMMUNE GLOBULIN 1500 UNIT/2ML IJ SOSY
300.0000 ug | PREFILLED_SYRINGE | Freq: Once | INTRAMUSCULAR | Status: AC
  Administered 2018-09-20: 300 ug via INTRAMUSCULAR
  Filled 2018-09-20: qty 2

## 2018-09-20 MED ORDER — IBUPROFEN 800 MG PO TABS: 800.0000 mg | ORAL_TABLET | Freq: Three times a day (TID) | ORAL | 0 refills | Status: DC | PRN

## 2018-09-20 NOTE — MAU Note (Signed)
Went to office last wk, preg confirmed. Sunday she started spotting, Monday she started cramping and more spotting. Tues, started bleeding and passing huge clots, cramping really bad.  Called dr on Wed, was told to take it easy.  Today she called them back, still cramping and bleeding, is pretty sure she is having a miscarriage, asked about [place]og[place], was told to come here.  Unsure how far along, as hadn't had a period since her delivery 5 mon ago.  Has confirmation letter from office.

## 2018-09-20 NOTE — Progress Notes (Signed)
Jennifer Rasch NP in earlier to discuss test results and d/c plan. Written and verbal d/c instructions given and understanding voiced. 

## 2018-09-20 NOTE — Discharge Instructions (Signed)
Miscarriage  A miscarriage is the loss of an unborn baby (fetus) before the 20th week of pregnancy. Most miscarriages happen during the first 3 months of pregnancy. Sometimes, a miscarriage can happen before a woman knows that she is pregnant.  Having a miscarriage can be an emotional experience. If you have had a miscarriage, talk with your health care provider about any questions you may have about miscarrying, the grieving process, and your plans for future pregnancy.  What are the causes?  A miscarriage may be caused by:  · Problems with the genes or chromosomes of the fetus. These problems make it impossible for the baby to develop normally. They are often the result of random errors that occur early in the development of the baby, and are not passed from parent to child (not inherited).  · Infection of the cervix or uterus.  · Conditions that affect hormone balance in the body.  · Problems with the cervix, such as the cervix opening and thinning before pregnancy is at term (cervical insufficiency).  · Problems with the uterus. These may include:  ? A uterus with an abnormal shape.  ? Fibroids in the uterus.  ? Congenital abnormalities. These are problems that were present at birth.  · Certain medical conditions.  · Smoking, drinking alcohol, or using drugs.  · Injury (trauma).  In many cases, the cause of a miscarriage is not known.  What are the signs or symptoms?  Symptoms of this condition include:  · Vaginal bleeding or spotting, with or without cramps or pain.  · Pain or cramping in the abdomen or lower back.  · Passing fluid, tissue, or blood clots from the vagina.  How is this diagnosed?  This condition may be diagnosed based on:  · A physical exam.  · Ultrasound.  · Blood tests.  · Urine tests.  How is this treated?  Treatment for a miscarriage is sometimes not necessary if you naturally pass all the tissue that was in your uterus. If necessary, this condition may be treated with:  · Dilation and  curettage (D&C). This is a procedure in which the cervix is stretched open and the lining of the uterus (endometrium) is scraped. This is done only if tissue from the fetus or placenta remains in the body (incomplete miscarriage).  · Medicines, such as:  ? Antibiotic medicine, to treat infection.  ? Medicine to help the body pass any remaining tissue.  ? Medicine to reduce (contract) the size of the uterus. These medicines may be given if you have a lot of bleeding.  If you have Rh negative blood and your baby was Rh positive, you will need a shot of a medicine called Rh immunoglobulinto protect your future babies from Rh blood problems. "Rh-negative" and "Rh-positive" refer to whether or not the blood has a specific protein found on the surface of red blood cells (Rh factor).  Follow these instructions at home:  Medicines    · Take over-the-counter and prescription medicines only as told by your health care provider.  · If you were prescribed antibiotic medicine, take it as told by your health care provider. Do not stop taking the antibiotic even if you start to feel better.  · Do not take NSAIDs, such as aspirin and ibuprofen, unless they are approved by your health care provider. These medicines can cause bleeding.  Activity  · Rest as directed. Ask your health care provider what activities are safe for you.  · Have someone   help with home and family responsibilities during this time.  General instructions  · Keep track of the number of sanitary pads you use each day and how soaked (saturated) they are. Write down this information.  · Monitor the amount of tissue or blood clots that you pass from your vagina. Save any large amounts of tissue for your health care provider to examine.  · Do not use tampons, douche, or have sex until your health care provider approves.  · To help you and your partner with the process of grieving, talk with your health care provider or seek counseling.  · When you are ready, meet with  your health care provider to discuss any important steps you should take for your health. Also, discuss steps you should take to have a healthy pregnancy in the future.  · Keep all follow-up visits as told by your health care provider. This is important.  Where to find more information  · The American Congress of Obstetricians and Gynecologists: www.acog.org  · U.[place]. Department of Health and Human Services Office of Women’s Health: www.womenshealth.gov  Contact a health care provider if:  · You have a fever or chills.  · You have a foul smelling vaginal discharge.  · You have more bleeding instead of less.  Get help right away if:  · You have severe cramps or pain in your back or abdomen.  · You pass blood clots or tissue from your vagina that is walnut-sized or larger.  · You soak more than 1 regular sanitary pad in an hour.  · You become light-headed or weak.  · You pass out.  · You have feelings of sadness that take over your thoughts, or you have thoughts of hurting yourself.  Summary  · Most miscarriages happen in the first 3 months of pregnancy. Sometimes miscarriage happens before a woman even knows that she is pregnant.  · Follow your health care provider's instruction for home care. Keep all follow-up appointments.  · To help you and your partner with the process of grieving, talk with your health care provider or seek counseling.  This information is not intended to replace advice given to you by your health care provider. Make sure you discuss any questions you have with your health care provider.  Document Released: 09/20/2000 Document Revised: 05/02/2016 Document Reviewed: 05/02/2016  Elsevier Interactive Patient Education © 2019 [employer]vier Inc[employer]

## 2018-09-20 NOTE — MAU Provider Note (Signed)
History     CSN: [id number]  Arrival date and time: 09/20/18 1718   First Provider Initiated Contact with Patient 09/20/18 1812      Chief Complaint  Patient presents with  . Vaginal Bleeding  . Abdominal Pain  . Threatened Miscarriage   HPI   [name] is 33 y.o. female [phone] @ Unknown here with vaginal bleeding that started Sunday. The bleeding waxes and wanes from heavy to light. At times she is passing large clots. Denies dizziness. She had her pregnancy confirmed in the office last week.  LMP unknown, had a baby 5 months ago and is breast feeding and taking birth control. Attests to occasional lower abdominal cramping that comes and goes.   OB History    Gravida  8   Para  4   Term  4   Preterm      AB  3   Living  4     SAB  3   TAB      Ectopic      Multiple  0   Live Births  4           Past Medical History:  Diagnosis Date  . Anemia   . Anxiety   . Depression    some problem in past, doing fine now  . Gestational diabetes   . Infection    UTI  . Migraines     Past Surgical History:  Procedure Laterality Date  . DILATION AND CURETTAGE OF UTERUS  2014    Family History  Problem Relation Age of Onset  . Stroke Mother   . Heart disease Father     Social History   Tobacco Use  . Smoking status: Former Smoker    Packs/day: 0.25    Types: Cigarettes  . Smokeless tobacco: Never Used  . Tobacco comment: 2018- quit  Substance Use Topics  . Alcohol use: No  . Drug use: No    Allergies:  Allergies  Allergen Reactions  . Penicillins Anaphylaxis    DID THE REACTION INVOLVE: Swelling of the face/tongue/throat, SOB, or low BP? No Sudden or severe rash/hives, skin peeling, or the inside of the mouth or nose? No Did it require medical treatment? No When did it last happen? If all above answers are "NO", may proceed with cephalosporin use.    Medications Prior to Admission  Medication Sig Dispense Refill Last Dose   . ibuprofen (ADVIL,MOTRIN) 600 MG tablet Take 1 tablet (600 mg total) by mouth every 6 (six) hours. 30 tablet 0 09/19/2018 at Unknown time  . ferrous sulfate 325 (65 FE) MG tablet Take 1 tablet (325 mg total) by mouth daily with breakfast. 60 tablet 3    Results for orders placed or performed during the hospital encounter of 09/20/18 (from the past 48 hour(s))  Rh IG workup (includes ABO/Rh)     Status: None (Preliminary result)   Collection Time: 09/20/18  6:38 PM  Result Value Ref Range   Gestational Age(Wks) 5    ABO/RH(D) O NEG    Antibody Screen NEG    Unit Number [id number]    Blood Component Type RHIG    Unit division 00    Status of Unit ISSUED    Transfusion Status      OK TO TRANSFUSE Performed at [facility], [address] [place], [place] [address]   CBC with Differential/Platelet     Status: Abnormal   Collection Time: 09/20/18  6:38 PM  Result Value Ref Range   WBC 8.7 4.0 - 10.5 K/uL   RBC 4.36 3.87 - 5.11 MIL/uL   Hemoglobin 11.8 (L) 12.0 - 15.0 g/dL   HCT 54.037.0 98.136.0 - 19.146.0 %   MCV 84.9 80.0 - 100.0 fL   MCH 27.1 26.0 - 34.0 pg   MCHC 31.9 30.0 - 36.0 g/dL   RDW 47.815.2 29.511.5 - 62.115.5 %   Platelets 208 150 - 400 K/uL   nRBC 0.0 0.0 - 0.2 %   Neutrophils Relative % 57 %   Neutro Abs 4.9 1.7 - 7.7 K/uL   Lymphocytes Relative 31 %   Lymphs Abs 2.7 0.7 - 4.0 K/uL   Monocytes Relative 11 %   Monocytes Absolute 0.9 0.1 - 1.0 K/uL   Eosinophils Relative 1 %   Eosinophils Absolute 0.1 0.0 - 0.5 K/uL   Basophils Relative 0 %   Basophils Absolute 0.0 0.0 - 0.1 K/uL   Immature Granulocytes 0 %   Abs Immature Granulocytes 0.02 0.00 - 0.07 K/uL    Comment: Performed at [facility], [address] [place], [place] [address]  Comprehensive metabolic panel     Status: Abnormal   Collection Time: 09/20/18  6:38 PM  Result Value Ref Range   Sodium 138 135 - 145 mmol/L   Potassium 4.2 3.5 - 5.1 mmol/L   Chloride 107 98 - 111 mmol/L   CO2 24 22 - 32 mmol/L    Glucose, Bld 93 70 - 99 mg/dL   BUN 12 6 - 20 mg/dL   Creatinine, Ser 7.840.78 0.44 - 1.00 mg/dL   Calcium 8.8 (L) 8.9 - 10.3 mg/dL   Total Protein 6.1 (L) 6.5 - 8.1 g/dL   Albumin 3.1 (L) 3.5 - 5.0 g/dL   AST 22 15 - 41 U/L   ALT 24 0 - 44 U/L   Alkaline Phosphatase 75 38 - 126 U/L   Total Bilirubin 0.3 0.3 - 1.2 mg/dL   GFR calc non Af Amer >60 >60 mL/min   GFR calc Af Amer >60 >60 mL/min   Anion gap 7 5 - 15    Comment: Performed at [facility], [address] [place], [place] [address]  hCG, quantitative, pregnancy     Status: Abnormal   Collection Time: 09/20/18  6:38 PM  Result Value Ref Range   hCG, Beta Chain, Quant, S 654 (H) <5 mIU/mL    Comment:          GEST. AGE      CONC.  (mIU/mL)   <=1 WEEK        5 - 50     2 WEEKS       50 - 500     3 WEEKS       100 - 10,000     4 WEEKS     1,000 - 30,000     5 WEEKS     3,500 - 115,000   6-8 WEEKS     12,000 - 270,000    12 WEEKS     15,000 - 220,000        FEMALE AND NON-PREGNANT FEMALE:     LESS THAN 5 mIU/mL Performed at [facility], [address] [place], [place] [address]    Review of Systems  Gastrointestinal: Positive for abdominal pain (Cramping in lower abdomen ).  Genitourinary: Positive for vaginal bleeding.   Physical Exam   Blood pressure 117/69, pulse 86, temperature 98  F (36.7 C), temperature source Oral, resp. rate 18, height 5\' 2"  (1.575 m), weight 95.4 kg, SpO2 100 %, currently breastfeeding.  Physical Exam  Constitutional: She is oriented to person, place, and time. She appears well-developed and well-nourished. No distress.  HENT:  Head: Normocephalic.  Eyes: Pupils are equal, round, and reactive to light.  GI: Soft. She exhibits no distension. There is abdominal tenderness in the periumbilical area and left lower quadrant. There is no rebound.  Genitourinary:    Genitourinary Comments: Cervix: closed, anterior. Small amount of dark red blood noted on exam glove.     Musculoskeletal: Normal range of motion.  Neurological: She is alert and oriented to person, place, and time.  Skin: Skin is warm. She is not diaphoretic.  Psychiatric: Her behavior is normal.   MAU Course  Procedures  None  MDM  CBC, CMP, ABO [place] ordered Rhogam given IM   Assessment and Plan   A:  1. Miscarriage, threatened, early pregnancy   2. Vaginal bleeding in pregnancy, first trimester   3. Pregnancy with gestation of unknown duration   4. Blood type, Rh negative     P:  Discharge home in stable condition Return to MAU if symptoms worsen  Rhogam given Follow up with [facility] next week for a Quant, message sent to the office Bleeding precautions Rx: Ibuprofen  Support given  Noni Saupe I, NP 09/20/2018 8:45 PM

## 2018-09-21 LAB — RH IG WORKUP (INCLUDES ABO/RH)
ABO/RH(D): O NEG
Antibody Screen: NEGATIVE
Gestational Age(Wks): 5
Unit division: 0

## 2020-10-12 LAB — OB RESULTS CONSOLE GC/CHLAMYDIA
Chlamydia: NEGATIVE
Gonorrhea: NEGATIVE

## 2020-11-22 LAB — OB RESULTS CONSOLE GC/CHLAMYDIA
Chlamydia: NEGATIVE
Gonorrhea: NEGATIVE

## 2020-11-22 LAB — OB RESULTS CONSOLE ANTIBODY SCREEN: Antibody Screen: NEGATIVE

## 2020-11-22 LAB — OB RESULTS CONSOLE RUBELLA ANTIBODY, IGM
Rubella: IMMUNE
Rubella: IMMUNE

## 2020-11-22 LAB — HEPATITIS C ANTIBODY
HCV Ab: NEGATIVE
HCV Ab: NEGATIVE

## 2020-11-22 LAB — OB RESULTS CONSOLE RPR: RPR: NONREACTIVE

## 2020-11-22 LAB — OB RESULTS CONSOLE ABO/RH: RH Type: NEGATIVE

## 2020-11-22 LAB — OB RESULTS CONSOLE HEPATITIS B SURFACE ANTIGEN
Hepatitis B Surface Ag: NEGATIVE
Hepatitis B Surface Ag: NEGATIVE

## 2020-11-22 LAB — OB RESULTS CONSOLE HIV ANTIBODY (ROUTINE TESTING)
HIV: NONREACTIVE
HIV: NONREACTIVE

## 2020-11-26 ENCOUNTER — Other Ambulatory Visit: Payer: Self-pay | Admitting: Obstetrics

## 2020-11-26 DIAGNOSIS — Z363 Encounter for antenatal screening for malformations: Secondary | ICD-10-CM

## 2020-11-26 DIAGNOSIS — Z3A19 19 weeks gestation of pregnancy: Secondary | ICD-10-CM

## 2020-11-26 DIAGNOSIS — O99212 Obesity complicating pregnancy, second trimester: Secondary | ICD-10-CM

## 2020-12-01 ENCOUNTER — Encounter (HOSPITAL_COMMUNITY): Payer: Self-pay

## 2020-12-01 ENCOUNTER — Emergency Department (HOSPITAL_COMMUNITY)
Admission: EM | Admit: 2020-12-01 | Discharge: 2020-12-01 | Disposition: A | Discharge status: Home / Self Care | Payer: Medicaid Other | Attending: Emergency Medicine | Admitting: Emergency Medicine

## 2020-12-01 ENCOUNTER — Other Ambulatory Visit: Payer: Self-pay

## 2020-12-01 DIAGNOSIS — O98512 Other viral diseases complicating pregnancy, second trimester: Secondary | ICD-10-CM | POA: Diagnosis not present

## 2020-12-01 DIAGNOSIS — Z3A15 15 weeks gestation of pregnancy: Secondary | ICD-10-CM | POA: Insufficient documentation

## 2020-12-01 DIAGNOSIS — O99282 Endocrine, nutritional and metabolic diseases complicating pregnancy, second trimester: Secondary | ICD-10-CM | POA: Diagnosis not present

## 2020-12-01 DIAGNOSIS — U071 COVID-19: Secondary | ICD-10-CM | POA: Diagnosis not present

## 2020-12-01 DIAGNOSIS — Z87891 Personal history of nicotine dependence: Secondary | ICD-10-CM | POA: Diagnosis not present

## 2020-12-01 DIAGNOSIS — R000 Tachycardia, unspecified: Secondary | ICD-10-CM | POA: Diagnosis not present

## 2020-12-01 DIAGNOSIS — O99280 Endocrine, nutritional and metabolic diseases complicating pregnancy, unspecified trimester: Secondary | ICD-10-CM

## 2020-12-01 DIAGNOSIS — E860 Dehydration: Secondary | ICD-10-CM | POA: Insufficient documentation

## 2020-12-01 LAB — BASIC METABOLIC PANEL
Anion gap: 7 (ref 5–15)
BUN: 7 mg/dL (ref 6–20)
CO2: 23 mmol/L (ref 22–32)
Calcium: 8.7 mg/dL — ABNORMAL LOW (ref 8.9–10.3)
Chloride: 106 mmol/L (ref 98–111)
Creatinine, Ser: 0.47 mg/dL (ref 0.44–1.00)
GFR, Estimated: >60 mL/min (ref >60)
Glucose, Bld: 107 mg/dL — ABNORMAL HIGH (ref 70–99)
Potassium: 3.5 mmol/L (ref 3.5–5.1)
Sodium: 136 mmol/L (ref 135–145)

## 2020-12-01 LAB — URINALYSIS, ROUTINE W REFLEX MICROSCOPIC
Bilirubin Urine: NEGATIVE
Glucose, UA: NEGATIVE mg/dL
Hgb urine dipstick: NEGATIVE
Ketones, ur: 20 mg/dL — AB
Leukocytes,Ua: NEGATIVE
Nitrite: NEGATIVE
Protein, ur: NEGATIVE mg/dL
Specific Gravity, Urine: 1.021 (ref 1.005–1.030)
pH: 5 (ref 5.0–8.0)

## 2020-12-01 LAB — CBC WITH DIFFERENTIAL/PLATELET
Abs Immature Granulocytes: 0.08 10*3/uL — ABNORMAL HIGH (ref 0.00–0.07)
Basophils Absolute: 0 10*3/uL (ref 0.0–0.1)
Basophils Relative: 0 %
Eosinophils Absolute: 0 10*3/uL (ref 0.0–0.5)
Eosinophils Relative: 0 %
HCT: 34.1 % — ABNORMAL LOW (ref 36.0–46.0)
Hemoglobin: 10.9 g/dL — ABNORMAL LOW (ref 12.0–15.0)
Immature Granulocytes: 1 %
Lymphocytes Relative: 7 %
Lymphs Abs: 0.7 10*3/uL (ref 0.7–4.0)
MCH: 25.7 pg — ABNORMAL LOW (ref 26.0–34.0)
MCHC: 32 g/dL (ref 30.0–36.0)
MCV: 80.4 fL (ref 80.0–100.0)
Monocytes Absolute: 0.9 10*3/uL (ref 0.1–1.0)
Monocytes Relative: 9 %
Neutro Abs: 8.5 10*3/uL — ABNORMAL HIGH (ref 1.7–7.7)
Neutrophils Relative %: 83 %
Platelets: 188 10*3/uL (ref 150–400)
RBC: 4.24 MIL/uL (ref 3.87–5.11)
RDW: 16.3 % — ABNORMAL HIGH (ref 11.5–15.5)
WBC: 10.2 10*3/uL (ref 4.0–10.5)
nRBC: 0 % (ref 0.0–0.2)

## 2020-12-01 MED ORDER — ACETAMINOPHEN 325 MG PO TABS
650.0000 mg | ORAL_TABLET | Freq: Once | ORAL | Status: AC
  Administered 2020-12-01: 650 mg via ORAL
  Filled 2020-12-01: qty 2

## 2020-12-01 MED ORDER — LACTATED RINGERS IV BOLUS
1000.0000 mL | Freq: Once | INTRAVENOUS | Status: AC
  Administered 2020-12-01: 1000 mL via INTRAVENOUS

## 2020-12-01 MED ORDER — ONDANSETRON HCL 4 MG/2ML IJ SOLN
4.0000 mg | Freq: Once | INTRAMUSCULAR | Status: AC
  Administered 2020-12-01: 4 mg via INTRAVENOUS
  Filled 2020-12-01: qty 2

## 2020-12-01 NOTE — ED Provider Notes (Signed)
Emergency Medicine Provider Triage Evaluation Note  Emma Hughes , a 35 y.o. female  was evaluated in triage.  Pt complains of emesis x 48 hours. Been unable to keep fluids down, called OBGN who directed her to come to ED for fluids. Covid + x 2 days, having fevers at home. Hasn't tried alleviating medicines.   Review of Systems  Positive: Vomiting, fevers, low back pain Negative: AP, vaginal bleeding  Physical Exam  There were no vitals taken for this visit. Gen:   Awake, no distress   Resp:  Normal effort  MSK:   Moves extremities without difficulty  Other:    Medical Decision Making  Medically screening exam initiated at 12:27 PM.  Appropriate orders placed.  Emma Hughes was informed that the remainder of the evaluation will be completed by another provider, this initial triage assessment does not replace that evaluation, and the importance of remaining in the ED until their evaluation is complete.     Theron Arista, PA-C 12/01/20 1228    Ernie Avena, MD 12/01/20 1311

## 2020-12-01 NOTE — ED Triage Notes (Signed)
Pt arrived via POV, c/o dehydration from cont. Vomiting x48 hrs. Tested covid positive this morning. Told to come to ED for fluids.

## 2020-12-01 NOTE — ED Provider Notes (Signed)
[facility]-EMERGENCY DEPT Provider Note   CSN: [id number] Arrival date & time: 12/01/20  1141     History Chief Complaint  Patient presents with   Covid Positive    Emma Hughes is a 35 y.o. female.  HPI  35 year old female sent by her OB [redacted] weeks pregnant presenting to the emergency department with concern for dehydration in the setting of symptoms of COVID-19.  The patient states that she has had symptoms for the past 2 days.  She endorses fever, chills, myalgias, nausea and vomiting.  She states that she was tested positive for COVID today.  Her OB/GYN was concerned for dehydration and recommended that she present to the emergency department for antiemetics and IV rehydration.  She has been unable to tolerate oral intake today.  Her OB/GYN also prescribed her Zofran and an antiviral for COVID-19.   Past Medical History:  Diagnosis Date   Anemia    Anxiety    Depression    some problem in past, doing fine now   Gestational diabetes    Infection    UTI   Migraines     Patient Active Problem List   Diagnosis Date Noted   GDM (gestational diabetes mellitus) 04/08/2018   Abnormal glucose tolerance test (GTT) during pregnancy, antepartum 10/19/2017   Normal labor 01/04/2014    Past Surgical History:  Procedure Laterality Date   DILATION AND CURETTAGE OF UTERUS  2014     OB History     Gravida  9   Para  4   Term  4   Preterm      AB  3   Living  4      SAB  3   IAB      Ectopic      Multiple  0   Live Births  4           Family History  Problem Relation Age of Onset   Stroke Mother    Heart disease Father     Social History   Tobacco Use   Smoking status: Former    Packs/day: 0.25    Types: Cigarettes   Smokeless tobacco: Never   Tobacco comments:    2018- quit  Vaping Use   Vaping Use: Never used  Substance Use Topics   Alcohol use: No   Drug use: No    Home Medications Prior to Admission medications    Medication Sig Start Date End Date Taking? Authorizing Provider  acetaminophen (TYLENOL) 500 MG tablet Take 1,000 mg by mouth every 6 (six) hours as needed for mild pain.   Yes [provider]  Multiple Vitamins-Minerals (EMERGEN-C IMMUNE) PACK Take 1 Package by mouth once as needed.   Yes [provider]  ondansetron (ZOFRAN) 4 MG tablet Take 4 mg by mouth every 8 (eight) hours as needed for nausea/vomiting. 12/01/20  Yes [provider]  ondansetron (ZOFRAN-ODT) 8 MG disintegrating tablet Take 8 mg by mouth every 8 (eight) hours as needed for nausea/vomiting. 10/12/20  Yes [provider]  ferrous sulfate 325 (65 FE) MG tablet Take 1 tablet (325 mg total) by mouth daily with breakfast. Patient not taking: Reported on 12/01/2020 04/11/18   Levi Aland, MD  ibuprofen (ADVIL) 800 MG tablet Take 1 tablet (800 mg total) by mouth every 8 (eight) hours as needed. Patient not taking: Reported on 12/01/2020 09/20/18   Rasch, Harolyn Rutherford, NP    Allergies    Penicillins  Review  of Systems   Review of Systems  Constitutional:  Positive for activity change, appetite change, chills, fatigue and fever.  HENT:  Negative for ear pain and sore throat.   Eyes:  Negative for pain and visual disturbance.  Respiratory:  Positive for cough and shortness of breath.   Cardiovascular:  Negative for chest pain and palpitations.  Gastrointestinal:  Positive for abdominal pain, nausea and vomiting.  Genitourinary:  Negative for dysuria and hematuria.  Musculoskeletal:  Positive for myalgias. Negative for arthralgias and back pain.  Skin:  Negative for color change and rash.  Neurological:  Negative for seizures and syncope.  All other systems reviewed and are negative.  Physical Exam Updated Vital Signs BP 118/71   Pulse 93   Temp 98.2 F (36.8 C) (Oral)   Resp 19   SpO2 99%   Physical Exam Vitals and nursing note reviewed.  Constitutional:      General: She is not in  acute distress.    Appearance: She is well-developed. She is ill-appearing.  HENT:     Head: Normocephalic and atraumatic.     Mouth/Throat:     Mouth: Mucous membranes are dry.  Eyes:     Conjunctiva/sclera: Conjunctivae normal.  Cardiovascular:     Rate and Rhythm: Regular rhythm. Tachycardia present.     Heart sounds: No murmur heard. Pulmonary:     Effort: Pulmonary effort is normal. No respiratory distress.     Breath sounds: Normal breath sounds.  Abdominal:     Palpations: Abdomen is soft.     Tenderness: There is no abdominal tenderness.     Comments: Gravid abdomen, soft, non-tender, non-distended  Musculoskeletal:     Cervical back: Neck supple.  Skin:    General: Skin is warm and dry.  Neurological:     General: No focal deficit present.     Mental Status: She is alert. Mental status is at baseline.    ED Results / Procedures / Treatments   Labs (all labs ordered are listed, but only abnormal results are displayed) Labs Reviewed  BASIC METABOLIC PANEL - Abnormal; Notable for the following components:      Result Value   Glucose, Bld 107 (*)    Calcium 8.7 (*)    All other components within normal limits  CBC WITH DIFFERENTIAL/PLATELET - Abnormal; Notable for the following components:   Hemoglobin 10.9 (*)    HCT 34.1 (*)    MCH 25.7 (*)    RDW 16.3 (*)    Neutro Abs 8.5 (*)    Abs Immature Granulocytes 0.08 (*)    All other components within normal limits  URINALYSIS, ROUTINE W REFLEX MICROSCOPIC - Abnormal; Notable for the following components:   APPearance HAZY (*)    Ketones, ur 20 (*)    All other components within normal limits  HCG, QUANTITATIVE, PREGNANCY    EKG None  Radiology No results found.  Procedures Procedures   Medications Ordered in ED Medications  lactated ringers bolus 1,000 mL (0 mLs Intravenous Stopped 12/01/20 1652)  acetaminophen (TYLENOL) tablet 650 mg (650 mg Oral Given 12/01/20 1445)  ondansetron (ZOFRAN) injection 4  mg (4 mg Intravenous Given 12/01/20 1445)    ED Course  I have reviewed the triage vital signs and the nursing notes.  Pertinent labs & imaging results that were available during my care of the patient were reviewed by me and considered in my medical decision making (see chart for details).    MDM Rules/Calculators/A&P  35 year old female sent by her OB [redacted] weeks pregnant presenting to the emergency department with concern for dehydration in the setting of symptoms of COVID-19.  The patient states that she has had symptoms for the past 2 days.  She endorses fever, chills, myalgias, nausea and vomiting.  She states that she was tested positive for COVID today.  Her OB/GYN was concerned for dehydration and recommended that she present to the emergency department for antiemetics and IV rehydration.  She has been unable to tolerate oral intake today.  Her OB/GYN also prescribed her Zofran and an antiviral for COVID-19.   On arrival, the patient was afebrile, mildly tachycardic pulse ranging from the low 100s to the 110s, not tachypneic, saturating well on room air.  An ambulatory pulse oximetry was performed in the emergency department with no noted desaturations on room air.  The patient is overall well-appearing with dry mucous membranes.  Concern for dehydration in the setting of her COVID-19 diagnosis.  Dry mucous membranes present on physical exam.  On exam, lungs were clear to auscultation bilaterally.  The patient was not tachypneic.  Labs were obtained without a leukocytosis, WBC 10.2, no platelet abnormality, BMP unremarkable.  Given the patient's physical exam, duration of symptoms and lab findings, I do not think chest x-ray is warranted at this time.  Urinalysis with 20 ketones, negative for UTI.  The patient was rehydrated IV with a 1 L IV fluid bolus.  Her tachycardia resolved.  She was overall well-appearing, ambulatory in the emergency department without  respiratory distress, with no oxygen requirement.  Overall stable for continued outpatient management.  Strict return precautions provided to the patient given her COVID-19 diagnosis.  She is already been prescribed antiemetics and COVID-19 antivirals by her OB/GYN.  Final Clinical Impression(s) / ED Diagnoses Final diagnoses:  COVID-19  Dehydration during pregnancy    Rx / DC Orders ED Discharge Orders     None        Ernie Avena, MD 12/01/20 1940

## 2020-12-01 NOTE — Discharge Instructions (Addendum)
Please follow-up as need with your OBGYN.  Return to the emergency department for worsening cough, shortness of breath, fever chills, inability to tolerate oral intake.  Recommend continued p.o. Zofran at home and continued outpatient oral hydration.

## 2020-12-24 ENCOUNTER — Encounter: Payer: Self-pay | Admitting: *Deleted

## 2020-12-29 ENCOUNTER — Other Ambulatory Visit: Payer: Self-pay

## 2020-12-29 ENCOUNTER — Other Ambulatory Visit: Payer: Self-pay | Admitting: *Deleted

## 2020-12-29 ENCOUNTER — Ambulatory Visit: Payer: Medicaid Other | Attending: Obstetrics

## 2020-12-29 ENCOUNTER — Ambulatory Visit: Payer: Medicaid Other | Admitting: *Deleted

## 2020-12-29 ENCOUNTER — Encounter: Payer: Self-pay | Admitting: *Deleted

## 2020-12-29 VITALS — BP 123/61 | HR 90

## 2020-12-29 DIAGNOSIS — O09522 Supervision of elderly multigravida, second trimester: Secondary | ICD-10-CM

## 2020-12-29 DIAGNOSIS — O99212 Obesity complicating pregnancy, second trimester: Secondary | ICD-10-CM | POA: Diagnosis not present

## 2020-12-29 DIAGNOSIS — Z363 Encounter for antenatal screening for malformations: Secondary | ICD-10-CM | POA: Insufficient documentation

## 2020-12-29 DIAGNOSIS — Z362 Encounter for other antenatal screening follow-up: Secondary | ICD-10-CM

## 2020-12-29 DIAGNOSIS — R638 Other symptoms and signs concerning food and fluid intake: Secondary | ICD-10-CM

## 2020-12-29 DIAGNOSIS — Z3689 Encounter for other specified antenatal screening: Secondary | ICD-10-CM

## 2020-12-29 DIAGNOSIS — Z3A19 19 weeks gestation of pregnancy: Secondary | ICD-10-CM | POA: Diagnosis not present

## 2021-01-04 ENCOUNTER — Emergency Department (HOSPITAL_COMMUNITY)
Admission: EM | Admit: 2021-01-04 | Discharge: 2021-01-05 | Disposition: A | Discharge status: Home / Self Care | Payer: Medicaid Other | Attending: Emergency Medicine | Admitting: Emergency Medicine

## 2021-01-04 ENCOUNTER — Other Ambulatory Visit: Payer: Self-pay

## 2021-01-04 DIAGNOSIS — N9489 Other specified conditions associated with female genital organs and menstrual cycle: Secondary | ICD-10-CM | POA: Insufficient documentation

## 2021-01-04 DIAGNOSIS — O26892 Other specified pregnancy related conditions, second trimester: Secondary | ICD-10-CM | POA: Diagnosis not present

## 2021-01-04 DIAGNOSIS — Z3A20 20 weeks gestation of pregnancy: Secondary | ICD-10-CM | POA: Diagnosis not present

## 2021-01-04 DIAGNOSIS — Z8616 Personal history of COVID-19: Secondary | ICD-10-CM | POA: Insufficient documentation

## 2021-01-04 DIAGNOSIS — G4489 Other headache syndrome: Secondary | ICD-10-CM

## 2021-01-04 DIAGNOSIS — O99352 Diseases of the nervous system complicating pregnancy, second trimester: Secondary | ICD-10-CM | POA: Diagnosis not present

## 2021-01-04 DIAGNOSIS — R42 Dizziness and giddiness: Secondary | ICD-10-CM

## 2021-01-04 DIAGNOSIS — R1013 Epigastric pain: Secondary | ICD-10-CM | POA: Insufficient documentation

## 2021-01-04 DIAGNOSIS — Z87891 Personal history of nicotine dependence: Secondary | ICD-10-CM | POA: Diagnosis not present

## 2021-01-04 DIAGNOSIS — R0602 Shortness of breath: Secondary | ICD-10-CM | POA: Diagnosis not present

## 2021-01-04 LAB — CBC WITH DIFFERENTIAL/PLATELET
Abs Immature Granulocytes: 0.08 10*3/uL — ABNORMAL HIGH (ref 0.00–0.07)
Basophils Absolute: 0 10*3/uL (ref 0.0–0.1)
Basophils Relative: 0 %
Eosinophils Absolute: 0 10*3/uL (ref 0.0–0.5)
Eosinophils Relative: 0 %
HCT: 35.3 % — ABNORMAL LOW (ref 36.0–46.0)
Hemoglobin: 11.2 g/dL — ABNORMAL LOW (ref 12.0–15.0)
Immature Granulocytes: 1 %
Lymphocytes Relative: 7 %
Lymphs Abs: 0.8 10*3/uL (ref 0.7–4.0)
MCH: 25.5 pg — ABNORMAL LOW (ref 26.0–34.0)
MCHC: 31.7 g/dL (ref 30.0–36.0)
MCV: 80.2 fL (ref 80.0–100.0)
Monocytes Absolute: 0.9 10*3/uL (ref 0.1–1.0)
Monocytes Relative: 7 %
Neutro Abs: 10 10*3/uL — ABNORMAL HIGH (ref 1.7–7.7)
Neutrophils Relative %: 85 %
Platelets: 231 10*3/uL (ref 150–400)
RBC: 4.4 MIL/uL (ref 3.87–5.11)
RDW: 15.3 % (ref 11.5–15.5)
WBC: 11.8 10*3/uL — ABNORMAL HIGH (ref 4.0–10.5)
nRBC: 0 % (ref 0.0–0.2)

## 2021-01-04 LAB — HCG, QUANTITATIVE, PREGNANCY: hCG, Beta Chain, Quant, S: 11125 m[IU]/mL — ABNORMAL HIGH (ref <5)

## 2021-01-04 LAB — COMPREHENSIVE METABOLIC PANEL
ALT: 24 U/L (ref 0–44)
AST: 25 U/L (ref 15–41)
Albumin: 2.6 g/dL — ABNORMAL LOW (ref 3.5–5.0)
Alkaline Phosphatase: 67 U/L (ref 38–126)
Anion gap: 7 (ref 5–15)
BUN: 8 mg/dL (ref 6–20)
CO2: 21 mmol/L — ABNORMAL LOW (ref 22–32)
Calcium: 8.5 mg/dL — ABNORMAL LOW (ref 8.9–10.3)
Chloride: 103 mmol/L (ref 98–111)
Creatinine, Ser: 0.55 mg/dL (ref 0.44–1.00)
GFR, Estimated: >60 mL/min (ref >60)
Glucose, Bld: 87 mg/dL (ref 70–99)
Potassium: 3.5 mmol/L (ref 3.5–5.1)
Sodium: 131 mmol/L — ABNORMAL LOW (ref 135–145)
Total Bilirubin: 0.6 mg/dL (ref 0.3–1.2)
Total Protein: 6.3 g/dL — ABNORMAL LOW (ref 6.5–8.1)

## 2021-01-04 LAB — LIPASE, BLOOD: Lipase: 28 U/L (ref 11–51)

## 2021-01-04 LAB — D-DIMER, QUANTITATIVE: D-Dimer, Quant: 2.65 ug/mL-FEU — ABNORMAL HIGH (ref 0.00–0.50)

## 2021-01-04 NOTE — ED Provider Notes (Addendum)
Emergency Medicine Provider Triage Evaluation Note  Emma Hughes , a 35 y.o. female  was evaluated in triage.  Pt complains of shortness of breath.  This is a G8 P4-0-3-4 who is currently [redacted] weeks pregnant.  She presents with shortness of breath since yesterday afternoon.  Developed back pain, nausea, vomiting vomiting today.  She also has sensations like she is going to pass out.  She endorses epigastric abdominal pain.  He does have associated cough no hemoptysis.  No vaginal discharge or vaginal bleeding.  Denies any leg swelling.  Once we get vitals, we will call over to [facility] to see if they accept patient. I spoke with Emma Hughes the [facility] APP at 1732.  Based on the history that I provided, she does not think patient is having a pregnancy related complication.  At this point she thinks that this patient is to be evaluated in the main ED.  Rapid response her OB should be available if we need fetal heart tones.  Review of Systems  Positive: Shortness of breath, abdominal pain, nausea, vomiting, back pain, cough Negative:  leg swelling, chest pain, diarrhea, vaginal discharge, vaginal bleeding  Physical Exam  LMP 08/17/2020  Gen:   Awake, no distress Resp:  Normal effort MSK:   Moves extremities without difficulty Other:  Patient appears to be in no acute distress.  She appears pregnant and pregnancy appears to be stated gestational age.  Medical Decision Making  Medically screening exam initiated at 5:27 PM.  Appropriate orders placed.  Emma Hughes was informed that the remainder of the evaluation will be completed by another provider, this initial triage assessment does not replace that evaluation, and the importance of remaining in the ED until their evaluation is complete.    Emma Leach, PA-C 01/04/21 1729    Emma Leach, PA-C 01/04/21 2126    Emma Norfolk, DO 01/04/21 2152

## 2021-01-04 NOTE — ED Triage Notes (Signed)
Pt c/o SHOB, HA, dizziness x "a couple days." States she's felt like she's going to pass out approx 3 times today. [redacted]wks pregnant, 9th pregnancy

## 2021-01-05 ENCOUNTER — Emergency Department (HOSPITAL_COMMUNITY): Payer: Medicaid Other

## 2021-01-05 LAB — URINALYSIS, ROUTINE W REFLEX MICROSCOPIC
Bilirubin Urine: NEGATIVE
Glucose, UA: NEGATIVE mg/dL
Hgb urine dipstick: NEGATIVE
Ketones, ur: 80 mg/dL — AB
Leukocytes,Ua: NEGATIVE
Nitrite: NEGATIVE
Protein, ur: NEGATIVE mg/dL
Specific Gravity, Urine: 1.016 (ref 1.005–1.030)
pH: 5 (ref 5.0–8.0)

## 2021-01-05 LAB — PROTEIN / CREATININE RATIO, URINE
Creatinine, Urine: 187.8 mg/dL
Protein Creatinine Ratio: 0.05 mg/mg{Cre} (ref 0.00–0.15)
Total Protein, Urine: 9 mg/dL

## 2021-01-05 MED ORDER — SODIUM CHLORIDE 0.9 % IV BOLUS (SEPSIS)
1000.0000 mL | Freq: Once | INTRAVENOUS | Status: AC
  Administered 2021-01-05: 1000 mL via INTRAVENOUS

## 2021-01-05 MED ORDER — METOCLOPRAMIDE HCL 5 MG/ML IJ SOLN
10.0000 mg | Freq: Once | INTRAMUSCULAR | Status: AC
  Administered 2021-01-05: 10 mg via INTRAVENOUS
  Filled 2021-01-05: qty 2

## 2021-01-05 MED ORDER — DIPHENHYDRAMINE HCL 50 MG/ML IJ SOLN
25.0000 mg | Freq: Once | INTRAMUSCULAR | Status: AC
  Administered 2021-01-05: 25 mg via INTRAVENOUS
  Filled 2021-01-05: qty 1

## 2021-01-05 MED ORDER — LACTATED RINGERS IV BOLUS
1000.0000 mL | Freq: Once | INTRAVENOUS | Status: AC
  Administered 2021-01-05: 1000 mL via INTRAVENOUS

## 2021-01-05 NOTE — ED Provider Notes (Signed)
[facility] EMERGENCY DEPARTMENT Provider Note   CSN: [id number] Arrival date & time: 01/04/21  1716     History Chief Complaint  Patient presents with   Shortness of Breath   Dizziness   Headache    Emma Hughes is a 35 y.o. female.  The history is provided by the patient.  Shortness of Breath Severity:  Moderate Onset quality:  Sudden Duration:  1 day Timing:  Intermittent Progression:  Improving Chronicity:  New Relieved by:  Nothing Exacerbated by: Lying flat. Associated symptoms: abdominal pain, cough, headaches and vomiting   Associated symptoms: no chest pain, no fever and no hemoptysis   Dizziness Quality:  Lightheadedness Severity:  Moderate Timing:  Intermittent Chronicity:  New Associated symptoms: headaches, nausea, shortness of breath and vomiting   Associated symptoms: no chest pain   Headache Associated symptoms: abdominal pain, cough, dizziness, nausea and vomiting   Associated symptoms: no fever   Patient is approximately 20 weeks and 1 day pregnant. Her symptoms began yesterday.  She reports she had onset of shortness of breath that appeared to be worse with lying flat.  She also had some dyspnea on exertion.  No chest pain.  No fevers.  Cough is reported but no hemoptysis.  Patient does report a history of COVID-19 last month. She reports her shortness of breath is now improving.  However over the past day she has been having low back pain and sharp pain in her upper abdomen.  No vaginal bleeding or discharge, no loss of fluid.  No lower ABD pain.  Patient also mentions frontal headache intermittently for 2 weeks.  No vision loss at this time.  No focal weakness in her arms or legs.  She reports she is told her OB doctor about this but unclear the cause of her headache  Other than the COVID-19 diagnosis, she has not had any complications with this pregnancy. Estimated date of delivery is May 24, 2021    Past Medical History:   Diagnosis Date   Anemia    Anxiety    Depression    some problem in past, doing fine now   Gestational diabetes    Infection    UTI   Migraines     Patient Active Problem List   Diagnosis Date Noted   GDM (gestational diabetes mellitus) 04/08/2018   Abnormal glucose tolerance test (GTT) during pregnancy, antepartum 10/19/2017   Normal labor 01/04/2014    Past Surgical History:  Procedure Laterality Date   DILATION AND CURETTAGE OF UTERUS  2014     OB History     Gravida  10   Para  4   Term  4   Preterm      AB  4   Living  4      SAB  4   IAB      Ectopic      Multiple  0   Live Births  4           Family History  Problem Relation Age of Onset   Stroke Mother    Heart disease Father     Social History   Tobacco Use   Smoking status: Former    Packs/day: 0.25    Types: Cigarettes   Smokeless tobacco: Never   Tobacco comments:    2018- quit  Vaping Use   Vaping Use: Never used  Substance Use Topics   Alcohol use: No   Drug use: No  Home Medications Prior to Admission medications   Medication Sig Start Date End Date Taking? Authorizing Provider  acetaminophen (TYLENOL) 500 MG tablet Take 1,000 mg by mouth every 6 (six) hours as needed for mild pain.    [provider]  ferrous sulfate 325 (65 FE) MG tablet Take 1 tablet (325 mg total) by mouth daily with breakfast. Patient not taking: Reported on 12/01/2020 04/11/18   Levi Aland, MD  ibuprofen (ADVIL) 800 MG tablet Take 1 tablet (800 mg total) by mouth every 8 (eight) hours as needed. Patient not taking: Reported on 12/01/2020 09/20/18   Rasch, Harolyn Rutherford, NP  Multiple Vitamins-Minerals (EMERGEN-C IMMUNE) PACK Take 1 Package by mouth once as needed.    [provider]  ondansetron (ZOFRAN) 4 MG tablet Take 4 mg by mouth every 8 (eight) hours as needed for nausea/vomiting. Patient not taking: Reported on 12/29/2020 12/01/20   [provider]  ondansetron  (ZOFRAN-ODT) 8 MG disintegrating tablet Take 8 mg by mouth every 8 (eight) hours as needed for nausea/vomiting. Patient not taking: Reported on 12/29/2020 10/12/20   [provider]    Allergies    Penicillins  Review of Systems   Review of Systems  Constitutional:  Negative for fever.  Respiratory:  Positive for cough and shortness of breath. Negative for hemoptysis.   Cardiovascular:  Negative for chest pain and leg swelling.  Gastrointestinal:  Positive for abdominal pain, nausea and vomiting.  Genitourinary:  Negative for vaginal bleeding and vaginal discharge.  Neurological:  Positive for dizziness, light-headedness and headaches. Negative for syncope.  All other systems reviewed and are negative.  Physical Exam Updated Vital Signs BP (!) 108/55   Pulse 93   Temp 98.4 F (36.9 C) (Oral)   Resp 19   LMP 08/17/2020   SpO2 99%   Physical Exam CONSTITUTIONAL: Well developed/well nourished, anxious HEAD: Normocephalic/atraumatic EYES: EOMI/PERRL, no nystagmus, no ptosis, visual acuity noted, see nursing notes ENMT: Mucous membranes moist NECK: supple no meningeal signs, no bruits SPINE/BACK:entire spine nontender CV: S1/S2 noted, no murmurs/rubs/gallops noted LUNGS: Lungs are clear to auscultation bilaterally, no apparent distress ABDOMEN: soft, mild epigastric tenderness, no rebound or guarding, gravid GU:no cva tenderness NEURO:Awake/alert, face symmetric, no arm or leg drift is noted Equal 5/5 strength with shoulder abduction, elbow flex/extension, wrist flex/extension in upper extremities and equal hand grips bilaterally Equal 5/5 strength with hip flexion,knee flex/extension, foot dorsi/plantar flexion Cranial nerves 3/4/5/6/10/16/08/11/12 tested and intact No past pointing Sensation to light touch intact in all extremities EXTREMITIES: pulses normal, full ROM no lower extremity edema or tenderness SKIN: warm, color normal PSYCH: Anxious  ED Results /  Procedures / Treatments   Labs (all labs ordered are listed, but only abnormal results are displayed) Labs Reviewed  COMPREHENSIVE METABOLIC PANEL - Abnormal; Notable for the following components:      Result Value   Sodium 131 (*)    CO2 21 (*)    Calcium 8.5 (*)    Total Protein 6.3 (*)    Albumin 2.6 (*)    All other components within normal limits  D-DIMER, QUANTITATIVE - Abnormal; Notable for the following components:   D-Dimer, Quant 2.65 (*)    All other components within normal limits  HCG, QUANTITATIVE, PREGNANCY - Abnormal; Notable for the following components:   hCG, Beta Chain, Quant, S 11,125 (*)    All other components within normal limits  CBC WITH DIFFERENTIAL/PLATELET - Abnormal; Notable for the following components:   WBC 11.8 (*)  Hemoglobin 11.2 (*)    HCT 35.3 (*)    MCH 25.5 (*)    Neutro Abs 10.0 (*)    Abs Immature Granulocytes 0.08 (*)    All other components within normal limits  URINALYSIS, ROUTINE W REFLEX MICROSCOPIC - Abnormal; Notable for the following components:   APPearance HAZY (*)    Ketones, ur 80 (*)    All other components within normal limits  LIPASE, BLOOD  PROTEIN / CREATININE RATIO, URINE    EKG EKG Interpretation  Date/Time:  Wednesday January 05 2021 01:28:43 EDT Ventricular Rate:  94 PR Interval:  129 QRS Duration: 83 QT Interval:  361 QTC Calculation: 452 R Axis:   58 Text Interpretation: Sinus rhythm No significant change since last tracing Confirmed by Zadie Rhine ([id number]) on 01/05/2021 1:33:13 AM  Radiology DG Chest 1 View  Result Date: 01/05/2021 CLINICAL DATA:  Shortness of breath EXAM: CHEST  1 VIEW COMPARISON:  04/16/2005 FINDINGS: Lungs are clear.  No pleural effusion or pneumothorax. The heart is normal in size. IMPRESSION: No evidence of acute cardiopulmonary disease. Electronically Signed   By: Charline Bills M.D.   On: 01/05/2021 02:00   MR BRAIN WO CONTRAST  Result Date: 01/05/2021 CLINICAL  DATA:  Headache. EXAM: MRI HEAD WITHOUT CONTRAST MRV HEAD WITHOUT CONTRAST TECHNIQUE: Multiplanar, multi-echo pulse sequences of the brain and surrounding structures were acquired without intravenous contrast. Angiographic images of the intracranial venous structures were acquired using MRV technique without intravenous contrast. COMPARISON:  05/08/2011 head CT FINDINGS: MRI HEAD WITHOUT CONTRAST Brain: No acute infarction, hemorrhage, hydrocephalus, extra-axial collection or mass lesion. Vascular: Normal flow voids. Skull and upper cervical spine: No focal marrow lesion. Relatively hypointense marrow signal diffusely, likely physiologic in the setting of pregnancy. Sinuses/Orbits: Essentially clear sinuses and negative orbits. MR VENOGRAM WITHOUT CONTRAST Dural sinuses are smoothly contoured and diffusely patent with dominant right transverse sigmoid drainage. Symmetric flow in paired deep veins. No evidence of cortical vein thrombosis. IMPRESSION: Negative brain MRI and MRV. Electronically Signed   By: Tiburcio Pea M.D.   On: 01/05/2021 06:34   MR MRV HEAD WO CM  Result Date: 01/05/2021 CLINICAL DATA:  Headache. EXAM: MRI HEAD WITHOUT CONTRAST MRV HEAD WITHOUT CONTRAST TECHNIQUE: Multiplanar, multi-echo pulse sequences of the brain and surrounding structures were acquired without intravenous contrast. Angiographic images of the intracranial venous structures were acquired using MRV technique without intravenous contrast. COMPARISON:  05/08/2011 head CT FINDINGS: MRI HEAD WITHOUT CONTRAST Brain: No acute infarction, hemorrhage, hydrocephalus, extra-axial collection or mass lesion. Vascular: Normal flow voids. Skull and upper cervical spine: No focal marrow lesion. Relatively hypointense marrow signal diffusely, likely physiologic in the setting of pregnancy. Sinuses/Orbits: Essentially clear sinuses and negative orbits. MR VENOGRAM WITHOUT CONTRAST Dural sinuses are smoothly contoured and diffusely patent  with dominant right transverse sigmoid drainage. Symmetric flow in paired deep veins. No evidence of cortical vein thrombosis. IMPRESSION: Negative brain MRI and MRV. Electronically Signed   By: Tiburcio Pea M.D.   On: 01/05/2021 06:34    Procedures Procedures   Medications Ordered in ED Medications  metoCLOPramide (REGLAN) injection 10 mg (has no administration in time range)  diphenhydrAMINE (BENADRYL) injection 25 mg (has no administration in time range)  sodium chloride 0.9 % bolus 1,000 mL (has no administration in time range)  lactated ringers bolus 1,000 mL (1,000 mLs Intravenous New Bag/Given 01/05/21 0116)  metoCLOPramide (REGLAN) injection 10 mg (10 mg Intravenous Given 01/05/21 0117)  diphenhydrAMINE (BENADRYL) injection 25 mg (25 mg  Intravenous Given 01/05/21 0118)    ED Course  I have reviewed the triage vital signs and the nursing notes.  Pertinent labs & imaging results that were available during my care of the patient were reviewed by me and considered in my medical decision making (see chart for details).    MDM Rules/Calculators/A&P                           Patient is over [redacted] weeks pregnant presenting with multiple complaints  #1 headache-no focal neurodeficits at this time.  She reports this is been intermittent.  Her blood pressures reveals systolic less than 140.  We will check visual acuity, treat headache and reassess.  We will also perform screening labs for preeclampsia  #2 shortness of breath.  She reports this been improving.  Will obtain EKG and chest x-ray.  Prior to my evaluation, D-dimer have been sent which is elevated which is not unusual for pregnancy.  Will await findings of EKG and chest x-ray prior to any further chest imaging  #3 abdominal pain-labs and urinalysis are pending.  I have asked for  a rapid OB nurse to evaluate the patient since she was over 20 weeks 3:28 AM Overall patient is feeling improved She denies any chest pain or shortness  of breath.  She ambulated without any hypoxia or tachycardia.  She denies any dyspnea on exertion.  She denies any chest pain on exertion.  She denied any lightheadedness with exertion At this point my concern for acute PE is very low, suspect the elevated dimer is due to pregnancy.  We will not proceed with CT chest at this time  She reports all of her abdominal pain is improved.  Labs are overall unremarkable except for dehydration.  She was seen by her rapid OB nurse.  Fetal heart tones were noted.  From an obstetric point of view, patient has been cleared  However patient reports persistent headache.  This is been intermittent for up to 2 weeks.  She reports visual changes at times but that is improved.  She has no focal weakness and is ambulatory.  However patient does appear uncomfortable. Preeclampsia has been effectively ruled out tonight However due to persistent headache, patient will need further evaluation. Will undergo MRI/MRV without contrast to evaluate for CVST Patient is agreeable with plan. 6:57 AM MRI and MRV are negative.  Patient reports headache is returned, but is improved.  She reports she has photophobia.  Now strongly considering migraine as a cause of her headache.  Her vital signs are appropriate. We continued  to monitor and reveals no tachycardia or hypoxia, patient has no chest pain or shortness of breath. At this point patient is safe and appropriate for discharge home.  She will call her OB/GYN for follow-up this week due to her recent abdominal pain and headache.  Final Clinical Impression(s) / ED Diagnoses Final diagnoses:  Other headache syndrome  Dizziness  Epigastric pain  [redacted] weeks gestation of pregnancy    Rx / DC Orders ED Discharge Orders     None        Zadie Rhine, MD 01/05/21 [phone]

## 2021-01-05 NOTE — Progress Notes (Addendum)
[facility] RN called by ED at [phone] about a G10P4 patient that is 20 weeks and 1 day presenting with headache, dizziness, and shortness of breath. [facility] RN arrived to ED at 0143 to assess the patient. The patient does not have any LOF or bleeding and does not feel like she is having contractions but is having some epigastric abdominal pain that she says comes and goes. The patient has had no complications with this pregnancy thus far and does not have any elevated blood pressures. The patient states that she has not felt the baby move in 2-3 days.  [facility] RN got a FHR of 141 via doppler with no decelerations noted. Dr. Tenny Craw was notified at 0206 about the patient and given a full report. Dr. Tenny Craw states that since fetal heart tones were present, the patient could be cleared from an OB standpoint.

## 2021-01-05 NOTE — Discharge Instructions (Addendum)
Please follow-up with your OB this week.  You are having a headache. No specific cause was found today for your headache. It may have been a migraine or other cause of headache. Stress, anxiety, fatigue, and depression are common triggers for headaches. Your headache today does not appear to be life-threatening or require hospitalization, but often the exact cause of headaches is not determined in the emergency department. Therefore, follow-up with your doctor is very important to find out what may have caused your headache, and whether or not you need any further diagnostic testing or treatment. Sometimes headaches can appear benign (not harmful), but then more serious symptoms can develop which should prompt an immediate re-evaluation by your doctor or the emergency department.  SEEK MEDICAL ATTENTION IF:  You develop possible problems with medications prescribed.  The medications don't resolve your headache, if it recurs , or if you have multiple episodes of vomiting or can't take fluids. You have a change from the usual headache.  RETURN IMMEDIATELY IF you develop a sudden, severe headache or confusion, become poorly responsive or faint, develop a fever above 100.19F or problem breathing, have a change in speech, vision, swallowing, or understanding, or develop new weakness, numbness, tingling, incoordination, or have a seizure.

## 2021-01-28 ENCOUNTER — Ambulatory Visit: Payer: Medicaid Other | Admitting: *Deleted

## 2021-01-28 ENCOUNTER — Ambulatory Visit: Payer: Medicaid Other | Attending: Maternal & Fetal Medicine

## 2021-01-28 ENCOUNTER — Other Ambulatory Visit: Payer: Self-pay

## 2021-01-28 VITALS — BP 111/61 | HR 77

## 2021-01-28 DIAGNOSIS — E669 Obesity, unspecified: Secondary | ICD-10-CM

## 2021-01-28 DIAGNOSIS — O99212 Obesity complicating pregnancy, second trimester: Secondary | ICD-10-CM | POA: Diagnosis not present

## 2021-01-28 DIAGNOSIS — Z6835 Body mass index (BMI) 35.0-35.9, adult: Secondary | ICD-10-CM | POA: Diagnosis present

## 2021-01-28 DIAGNOSIS — O09522 Supervision of elderly multigravida, second trimester: Secondary | ICD-10-CM | POA: Insufficient documentation

## 2021-01-28 DIAGNOSIS — Z362 Encounter for other antenatal screening follow-up: Secondary | ICD-10-CM | POA: Insufficient documentation

## 2021-01-28 DIAGNOSIS — Z3A23 23 weeks gestation of pregnancy: Secondary | ICD-10-CM

## 2021-01-28 DIAGNOSIS — R638 Other symptoms and signs concerning food and fluid intake: Secondary | ICD-10-CM | POA: Diagnosis present

## 2021-01-28 DIAGNOSIS — O358XX0 Maternal care for other (suspected) fetal abnormality and damage, not applicable or unspecified: Secondary | ICD-10-CM | POA: Diagnosis not present

## 2021-04-10 NOTE — L&D Delivery Note (Signed)
Delivery Note At around 1400 pt was having significant discomfort despite epidural, she requested AROM to help facilitate delivery.  AROM done with possible light meconium.  She progressed from a rim to complete with one ctx and pushed well.  At 2:14 PM a viable female was delivered via Vaginal, Spontaneous (Presentation: vtx, LOA).  APGAR: 8, 9; weight pending.   Placenta status: Spontaneous, Intact.  Cord: 3 vessels with the following complications: None.   Anesthesia: Epidural Episiotomy: None Lacerations: Periurethral Suture Repair:  none Est. Blood Loss (mL): 150  Mom to postpartum.  Baby to Couplet care / Skin to Skin.  They would like baby circumcised, questions answered  Zenaida Niece 05/21/2021, 2:27 PM

## 2021-05-02 LAB — OB RESULTS CONSOLE GBS: GBS: POSITIVE

## 2021-05-14 ENCOUNTER — Encounter (HOSPITAL_COMMUNITY): Payer: Self-pay | Admitting: Obstetrics and Gynecology

## 2021-05-14 ENCOUNTER — Other Ambulatory Visit: Payer: Self-pay

## 2021-05-14 ENCOUNTER — Inpatient Hospital Stay (HOSPITAL_COMMUNITY)
Admission: AD | Admit: 2021-05-14 | Discharge: 2021-05-14 | Disposition: A | Discharge status: Home / Self Care | Payer: Medicaid Other | Attending: Obstetrics and Gynecology | Admitting: Obstetrics and Gynecology

## 2021-05-14 DIAGNOSIS — Z3689 Encounter for other specified antenatal screening: Secondary | ICD-10-CM

## 2021-05-14 DIAGNOSIS — Z0371 Encounter for suspected problem with amniotic cavity and membrane ruled out: Secondary | ICD-10-CM | POA: Insufficient documentation

## 2021-05-14 DIAGNOSIS — O471 False labor at or after 37 completed weeks of gestation: Secondary | ICD-10-CM | POA: Insufficient documentation

## 2021-05-14 DIAGNOSIS — Z3A38 38 weeks gestation of pregnancy: Secondary | ICD-10-CM

## 2021-05-14 LAB — POCT FERN TEST: POCT Fern Test: NEGATIVE

## 2021-05-14 NOTE — MAU Provider Note (Signed)
Event Date/Time   First Provider Initiated Contact with Patient 05/14/21 2056     S: Ms. Emma Hughes is a 36 y.o. [id number] at [redacted]w[redacted]d  who presents to MAU today complaining of leaking of fluid since last night around 10pm. She denies vaginal bleeding. She endorses contractions. She reports normal fetal movement.    O: BP 128/75    Pulse 90    Temp 98 F (36.7 C)    Resp 18    Ht 5' 2.5" (1.588 m)    Wt 98.4 kg    LMP 08/17/2020    BMI 39.06 kg/m  GENERAL: Well-developed, well-nourished female in no acute distress.  HEAD: Normocephalic, atraumatic.  CHEST: Normal effort of breathing, regular heart rate ABDOMEN: Soft, nontender, gravid PELVIC: Normal external female genitalia. Vagina is pink and rugated. Cervix with normal contour, no lesions. Normal discharge.  Negative pooling. Fern Collected.  Cervical exam:  Dilation: 1 Effacement (%): 50 Station: Ballotable Presentation: Undeterminable Exam by:: weston,rn   Fetal Monitoring: FHT: 150 bpm, Mod Var, -Decels, +Accels Toco: Occasional Mild Ctx Graphed  Results for orders placed or performed during the hospital encounter of 05/14/21 (from the past 24 hour(s))  POCT fern test     Status: Normal   Collection Time: 05/14/21  8:42 PM  Result Value Ref Range   POCT Fern Test Negative = intact amniotic membranes      A: SIUP at [redacted]w[redacted]d  Membranes intact Cat I FT  P: Fern negative Provider to bedside to discuss. Cervix with minimal dilation Plan to discharge to home with precautions Follow up as scheduled Return as necessary  Gerrit Heck, [place] 05/14/2021 9:32 PM

## 2021-05-14 NOTE — MAU Note (Signed)
Pt stated she started notice leaking clear fluid since yesterday. Has continues today. No big gushes. Reports mild ctx about q 15 min. Good fetal movement felt.

## 2021-05-18 ENCOUNTER — Telehealth (HOSPITAL_COMMUNITY): Payer: Self-pay | Admitting: *Deleted

## 2021-05-18 NOTE — Telephone Encounter (Signed)
Preadmission screen  

## 2021-05-19 ENCOUNTER — Encounter (HOSPITAL_COMMUNITY): Payer: Self-pay | Admitting: *Deleted

## 2021-05-21 ENCOUNTER — Inpatient Hospital Stay (HOSPITAL_COMMUNITY): Payer: Medicaid Other | Admitting: Anesthesiology

## 2021-05-21 ENCOUNTER — Encounter (HOSPITAL_COMMUNITY): Payer: Self-pay | Admitting: Obstetrics

## 2021-05-21 ENCOUNTER — Other Ambulatory Visit: Payer: Self-pay

## 2021-05-21 ENCOUNTER — Inpatient Hospital Stay (HOSPITAL_COMMUNITY)
Admission: AD | Admit: 2021-05-21 | Discharge: 2021-05-23 | DRG: 807 | Disposition: A | Discharge status: Home / Self Care | Payer: Medicaid Other | Attending: Obstetrics and Gynecology | Admitting: Obstetrics and Gynecology

## 2021-05-21 DIAGNOSIS — Z880 Allergy status to penicillin: Secondary | ICD-10-CM | POA: Diagnosis not present

## 2021-05-21 DIAGNOSIS — O99214 Obesity complicating childbirth: Secondary | ICD-10-CM | POA: Diagnosis present

## 2021-05-21 DIAGNOSIS — Z3A39 39 weeks gestation of pregnancy: Secondary | ICD-10-CM

## 2021-05-21 DIAGNOSIS — Z6791 Unspecified blood type, Rh negative: Secondary | ICD-10-CM

## 2021-05-21 DIAGNOSIS — O99824 Streptococcus B carrier state complicating childbirth: Secondary | ICD-10-CM | POA: Diagnosis present

## 2021-05-21 DIAGNOSIS — Z20822 Contact with and (suspected) exposure to covid-19: Secondary | ICD-10-CM | POA: Diagnosis present

## 2021-05-21 DIAGNOSIS — O26893 Other specified pregnancy related conditions, third trimester: Secondary | ICD-10-CM | POA: Diagnosis present

## 2021-05-21 DIAGNOSIS — O480 Post-term pregnancy: Secondary | ICD-10-CM | POA: Diagnosis present

## 2021-05-21 DIAGNOSIS — Z87891 Personal history of nicotine dependence: Secondary | ICD-10-CM

## 2021-05-21 LAB — CBC
HCT: 34.2 % — ABNORMAL LOW (ref 36.0–46.0)
Hemoglobin: 10.1 g/dL — ABNORMAL LOW (ref 12.0–15.0)
MCH: 22.2 pg — ABNORMAL LOW (ref 26.0–34.0)
MCHC: 29.5 g/dL — ABNORMAL LOW (ref 30.0–36.0)
MCV: 75.2 fL — ABNORMAL LOW (ref 80.0–100.0)
Platelets: 290 10*3/uL (ref 150–400)
RBC: 4.55 MIL/uL (ref 3.87–5.11)
RDW: 18.5 % — ABNORMAL HIGH (ref 11.5–15.5)
WBC: 11.7 10*3/uL — ABNORMAL HIGH (ref 4.0–10.5)
nRBC: 0 % (ref 0.0–0.2)

## 2021-05-21 LAB — RESP PANEL BY RT-PCR (FLU A&B, COVID) ARPGX2
Influenza A by PCR: NEGATIVE
Influenza B by PCR: NEGATIVE
SARS Coronavirus 2 by RT PCR: NEGATIVE

## 2021-05-21 LAB — TYPE AND SCREEN
ABO/RH(D): O NEG
Antibody Screen: NEGATIVE

## 2021-05-21 LAB — RPR: RPR Ser Ql: NONREACTIVE

## 2021-05-21 MED ORDER — PRENATAL MULTIVITAMIN CH
1.0000 | ORAL_TABLET | Freq: Every day | ORAL | Status: DC
  Administered 2021-05-22: 1 via ORAL
  Filled 2021-05-21: qty 1

## 2021-05-21 MED ORDER — EPHEDRINE 5 MG/ML INJ: 10.0000 mg | INTRAVENOUS | Status: DC | PRN

## 2021-05-21 MED ORDER — MEASLES, MUMPS & RUBELLA VAC IJ SOLR: 0.5000 mL | Freq: Once | INTRAMUSCULAR | Status: DC

## 2021-05-21 MED ORDER — OXYCODONE-ACETAMINOPHEN 5-325 MG PO TABS: 2.0000 | ORAL_TABLET | ORAL | Status: DC | PRN

## 2021-05-21 MED ORDER — OXYTOCIN BOLUS FROM INFUSION
333.0000 mL | Freq: Once | INTRAVENOUS | Status: AC
  Administered 2021-05-21: 333 mL via INTRAVENOUS

## 2021-05-21 MED ORDER — OXYCODONE HCL 5 MG PO TABS
5.0000 mg | ORAL_TABLET | ORAL | Status: DC | PRN
  Administered 2021-05-22: 5 mg via ORAL
  Filled 2021-05-21: qty 1

## 2021-05-21 MED ORDER — SENNOSIDES-DOCUSATE SODIUM 8.6-50 MG PO TABS
2.0000 | ORAL_TABLET | Freq: Every day | ORAL | Status: DC
  Administered 2021-05-22: 2 via ORAL
  Filled 2021-05-21: qty 2

## 2021-05-21 MED ORDER — VANCOMYCIN HCL IN DEXTROSE 1-5 GM/200ML-% IV SOLN
1000.0000 mg | Freq: Two times a day (BID) | INTRAVENOUS | Status: DC
  Filled 2021-05-21: qty 200

## 2021-05-21 MED ORDER — OXYTOCIN-SODIUM CHLORIDE 30-0.9 UT/500ML-% IV SOLN
2.5000 [IU]/h | INTRAVENOUS | Status: DC
Start: 2021-05-21 — End: 2021-05-21
  Filled 2021-05-21: qty 500

## 2021-05-21 MED ORDER — ONDANSETRON HCL 4 MG/2ML IJ SOLN: 4.0000 mg | INTRAMUSCULAR | Status: DC | PRN

## 2021-05-21 MED ORDER — WITCH HAZEL-GLYCERIN EX PADS
1.0000 | MEDICATED_PAD | CUTANEOUS | Status: DC | PRN
Start: 2021-05-21 — End: 2021-05-23
  Administered 2021-05-21: 1 via TOPICAL

## 2021-05-21 MED ORDER — DIBUCAINE (PERIANAL) 1 % EX OINT: 1.0000 "application " | TOPICAL_OINTMENT | CUTANEOUS | Status: DC | PRN

## 2021-05-21 MED ORDER — VANCOMYCIN HCL IN DEXTROSE 1-5 GM/200ML-% IV SOLN: 1000.0000 mg | Freq: Two times a day (BID) | INTRAVENOUS | Status: DC

## 2021-05-21 MED ORDER — VANCOMYCIN HCL 10 G IV SOLR
2000.0000 mg | Freq: Once | INTRAVENOUS | Status: AC
  Administered 2021-05-21: 2000 mg via INTRAVENOUS
  Filled 2021-05-21: qty 20

## 2021-05-21 MED ORDER — LIDOCAINE HCL (PF) 1 % IJ SOLN
INTRAMUSCULAR | Status: DC | PRN
  Administered 2021-05-21 (×2): 5 mL via EPIDURAL

## 2021-05-21 MED ORDER — IBUPROFEN 600 MG PO TABS
600.0000 mg | ORAL_TABLET | Freq: Four times a day (QID) | ORAL | Status: DC
  Administered 2021-05-21 – 2021-05-23 (×7): 600 mg via ORAL
  Filled 2021-05-21 (×8): qty 1

## 2021-05-21 MED ORDER — SIMETHICONE 80 MG PO CHEW: 80.0000 mg | CHEWABLE_TABLET | ORAL | Status: DC | PRN

## 2021-05-21 MED ORDER — FENTANYL-BUPIVACAINE-NACL 0.5-0.125-0.9 MG/250ML-% EP SOLN
EPIDURAL | Status: DC | PRN
  Administered 2021-05-21: 12 mL/h via EPIDURAL

## 2021-05-21 MED ORDER — LACTATED RINGERS IV SOLN: INTRAVENOUS | Status: DC

## 2021-05-21 MED ORDER — FENTANYL-BUPIVACAINE-NACL 0.5-0.125-0.9 MG/250ML-% EP SOLN
EPIDURAL | Status: AC
  Filled 2021-05-21: qty 250

## 2021-05-21 MED ORDER — OXYCODONE HCL 5 MG PO TABS: 10.0000 mg | ORAL_TABLET | ORAL | Status: DC | PRN

## 2021-05-21 MED ORDER — ONDANSETRON HCL 4 MG PO TABS: 4.0000 mg | ORAL_TABLET | ORAL | Status: DC | PRN

## 2021-05-21 MED ORDER — DIPHENHYDRAMINE HCL 50 MG/ML IJ SOLN: 12.5000 mg | INTRAMUSCULAR | Status: DC | PRN

## 2021-05-21 MED ORDER — SOD CITRATE-CITRIC ACID 500-334 MG/5ML PO SOLN: 30.0000 mL | ORAL | Status: DC | PRN

## 2021-05-21 MED ORDER — OXYCODONE-ACETAMINOPHEN 5-325 MG PO TABS: 1.0000 | ORAL_TABLET | ORAL | Status: DC | PRN

## 2021-05-21 MED ORDER — TETANUS-DIPHTH-ACELL PERTUSSIS 5-2.5-18.5 LF-MCG/0.5 IM SUSY: 0.5000 mL | PREFILLED_SYRINGE | Freq: Once | INTRAMUSCULAR | Status: DC

## 2021-05-21 MED ORDER — CLINDAMYCIN PHOSPHATE 900 MG/50ML IV SOLN: 900.0000 mg | Freq: Once | INTRAVENOUS | Status: DC

## 2021-05-21 MED ORDER — LACTATED RINGERS IV SOLN
500.0000 mL | INTRAVENOUS | Status: DC | PRN
  Administered 2021-05-21: 500 mL via INTRAVENOUS

## 2021-05-21 MED ORDER — TERBUTALINE SULFATE 1 MG/ML IJ SOLN: 0.2500 mg | Freq: Once | INTRAMUSCULAR | Status: DC | PRN

## 2021-05-21 MED ORDER — DIPHENHYDRAMINE HCL 25 MG PO CAPS: 25.0000 mg | ORAL_CAPSULE | Freq: Four times a day (QID) | ORAL | Status: DC | PRN

## 2021-05-21 MED ORDER — ACETAMINOPHEN 325 MG PO TABS
650.0000 mg | ORAL_TABLET | ORAL | Status: DC | PRN
  Administered 2021-05-22 (×2): 650 mg via ORAL
  Filled 2021-05-21 (×2): qty 2

## 2021-05-21 MED ORDER — ONDANSETRON HCL 4 MG/2ML IJ SOLN
4.0000 mg | Freq: Four times a day (QID) | INTRAMUSCULAR | Status: DC | PRN
  Administered 2021-05-21: 4 mg via INTRAVENOUS
  Filled 2021-05-21: qty 2

## 2021-05-21 MED ORDER — LIDOCAINE HCL (PF) 1 % IJ SOLN: 30.0000 mL | INTRAMUSCULAR | Status: DC | PRN

## 2021-05-21 MED ORDER — FENTANYL-BUPIVACAINE-NACL 0.5-0.125-0.9 MG/250ML-% EP SOLN: 12.0000 mL/h | EPIDURAL | Status: DC | PRN

## 2021-05-21 MED ORDER — COCONUT OIL OIL
1.0000 "application " | TOPICAL_OIL | Status: DC | PRN
  Administered 2021-05-21: 1 via TOPICAL

## 2021-05-21 MED ORDER — METHYLERGONOVINE MALEATE 0.2 MG/ML IJ SOLN: 0.2000 mg | INTRAMUSCULAR | Status: DC | PRN

## 2021-05-21 MED ORDER — MISOPROSTOL 25 MCG QUARTER TABLET: 25.0000 ug | ORAL_TABLET | ORAL | Status: DC | PRN

## 2021-05-21 MED ORDER — PHENYLEPHRINE 40 MCG/ML (10ML) SYRINGE FOR IV PUSH (FOR BLOOD PRESSURE SUPPORT): 80.0000 ug | PREFILLED_SYRINGE | INTRAVENOUS | Status: DC | PRN

## 2021-05-21 MED ORDER — ACETAMINOPHEN 325 MG PO TABS: 650.0000 mg | ORAL_TABLET | ORAL | Status: DC | PRN

## 2021-05-21 MED ORDER — LACTATED RINGERS IV SOLN: 500.0000 mL | Freq: Once | INTRAVENOUS | Status: DC

## 2021-05-21 MED ORDER — FENTANYL CITRATE (PF) 100 MCG/2ML IJ SOLN: 50.0000 ug | INTRAMUSCULAR | Status: DC | PRN

## 2021-05-21 MED ORDER — MAGNESIUM HYDROXIDE 400 MG/5ML PO SUSP: 30.0000 mL | ORAL | Status: DC | PRN

## 2021-05-21 MED ORDER — ZOLPIDEM TARTRATE 5 MG PO TABS: 5.0000 mg | ORAL_TABLET | Freq: Every evening | ORAL | Status: DC | PRN

## 2021-05-21 MED ORDER — RHO D IMMUNE GLOBULIN 1500 UNIT/2ML IJ SOSY
300.0000 ug | PREFILLED_SYRINGE | Freq: Once | INTRAMUSCULAR | Status: DC
  Filled 2021-05-21: qty 2

## 2021-05-21 MED ORDER — BENZOCAINE-MENTHOL 20-0.5 % EX AERO
1.0000 "application " | INHALATION_SPRAY | CUTANEOUS | Status: DC | PRN
  Administered 2021-05-21: 1 via TOPICAL
  Filled 2021-05-21: qty 56

## 2021-05-21 MED ORDER — METHYLERGONOVINE MALEATE 0.2 MG PO TABS: 0.2000 mg | ORAL_TABLET | ORAL | Status: DC | PRN

## 2021-05-21 NOTE — Lactation Note (Signed)
This note was copied from a baby's chart. Lactation Consultation Note  Patient Name: Boy Kerri Kovacik QIONG'E Date: 05/21/2021 Reason for consult: Follow-up assessment;Mother's request;Difficult latch;Term;Maternal endocrine disorder;Breastfeeding assistance;Nipple pain/trauma Age:35 hours  Mom called for latch assistance nipples sore bruising and bleeding. Mom using coconut oil and EBM for nipple care.  LC worked on different positions to get more depth, throughout latch attempts, dimpling of the cheeks, infant gagging when attempting deeper latch.  Mom given hand pump with 27 flange states comfortable fit. 3 ml of colostrum given on spoon.  RN, Vonna Kotyk aware of findings above and will assist Mom with latching. RN working on assessment with Mom and infant at the end of the [facility] visit.   Plan 1. To feed based on cues 8-12x 24hr period. Mom to offer breasts and look for signs of milk transfer.  2. Mom to supplement EBM 5-7 ml per feeding. Mom aware to offer more if infant not latching.  3. Mom to use hand pump post pump q 3hrs 10 min each breast to get colostrum to supplement.  All questions answered at the end of the visit.  Mom denied any pain with the latch attempts or use of pump with larger 27 flange stated better fit.   Maternal Data Has patient been taught Hand Expression?: Yes Does the patient have breastfeeding experience prior to this delivery?: Yes How long did the patient breastfeed?: last child for 1 year  Feeding Mother's Current Feeding Choice: Breast Milk  LATCH Score                    Lactation Tools Discussed/Used Tools: Pump;Flanges;Coconut oil (RN Vonna Kotyk to provide coconut oil.) Flange Size: 27 Breast pump type: Manual Pump Education: Setup, frequency, and cleaning;Milk Storage Reason for Pumping: elongate nipple Pumping frequency: pre pump 5-10 min before latching  Interventions Interventions: Breast feeding basics reviewed;Support  pillows;Education;Assisted with latch;Position options;Skin to skin;Expressed milk;Breast massage;Coconut oil;Hand express;Infant Driven Feeding Algorithm education;Breast compression;Adjust position;Hand pump;Pre-pump if needed  Discharge Pump: Manual WIC Program: Yes  Consult Status Consult Status: Follow-up Date: 05/22/21 Follow-up type: In-patient    Shellye Zandi  Nicholson-Springer 05/21/2021, 10:15 PM

## 2021-05-21 NOTE — Anesthesia Preprocedure Evaluation (Signed)
Anesthesia Evaluation  Patient identified by MRN, date of birth, ID band Patient awake    Reviewed: Allergy & Precautions, Patient's Chart, lab work & pertinent test results  Airway Mallampati: II  TM Distance: >3 FB Neck ROM: Full    Dental no notable dental hx. (+) Teeth Intact   Pulmonary neg pulmonary ROS, former smoker,    Pulmonary exam normal        Cardiovascular negative cardio ROS Normal cardiovascular exam     Neuro/Psych  Headaches, PSYCHIATRIC DISORDERS Anxiety Depression    GI/Hepatic Neg liver ROS, GERD  Medicated,  Endo/Other  diabetes, GestationalObesity  Renal/GU negative Renal ROS  negative genitourinary   Musculoskeletal negative musculoskeletal ROS (+)   Abdominal (+) + obese,   Peds  Hematology  (+) Blood dyscrasia, anemia ,   Anesthesia Other Findings   Reproductive/Obstetrics (+) Pregnancy                             Anesthesia Physical Anesthesia Plan  ASA: 2  Anesthesia Plan: Epidural   Post-op Pain Management:    Induction:   PONV Risk Score and Plan:   Airway Management Planned: Natural Airway  Additional Equipment:   Intra-op Plan:   Post-operative Plan:   Informed Consent: I have reviewed the patients History and Physical, chart, labs and discussed the procedure including the risks, benefits and alternatives for the proposed anesthesia with the patient or authorized representative who has indicated his/her understanding and acceptance.       Plan Discussed with: Anesthesiologist  Anesthesia Plan Comments:         Anesthesia Quick Evaluation

## 2021-05-21 NOTE — Lactation Note (Signed)
This note was copied from a baby's chart. Lactation Consultation Note  Patient Name: Emma Hughes Date: 05/21/2021 Reason for consult: L&D Initial assessment;Term;Maternal endocrine disorder Age:35 hours  LC assisted with first latch in L&D.  Baby latched easily and vigorously feeding.  Mom to ask for assistance prn.  Maternal Data Has patient been taught Hand Expression?: Yes Does the patient have breastfeeding experience prior to this delivery?: Yes How long did the patient breastfeed?: Breastfed 4 babies, last one for a year  Feeding Mother's Current Feeding Choice: Breast Milk  LATCH Score Latch: Grasps breast easily, tongue down, lips flanged, rhythmical sucking.  Audible Swallowing: Spontaneous and intermittent  Type of Nipple: Everted at rest and after stimulation  Comfort (Breast/Nipple): Soft / non-tender  Hold (Positioning): Assistance needed to correctly position infant at breast and maintain latch.  LATCH Score: 9   Interventions Interventions: Breast feeding basics reviewed;Assisted with latch;Skin to skin;Breast massage;Hand express;Adjust position;Support pillows   Consult Status Consult Status: Follow-up from L&D Date: 05/21/21 Follow-up type: In-patient    Emma Hughes 05/21/2021, 2:54 PM

## 2021-05-21 NOTE — Lactation Note (Signed)
This note was copied from a baby's chart. Lactation Consultation Note  Patient Name: Emma Hughes RJJOA'C Date: 05/21/2021 Reason for consult: Initial assessment;Term Age:34 hours   P4 mother whose infant is now 2 hours old.  This is a term baby at 39+4 weeks.  Mother breast fed her other children varying lengths of time with the last child being breast fed for one year.  Mother's current feeding preference is breast.  Baby "Eliberto Ivory" was asleep in father's arms when I arrived.  Mother latched well after birth.  She is familiar with hand expression; suggested breast massage and hand expression prior to latching.  Mother has seen colostrum drops.  She had no questions/concerns at this time.  Will call as needed for latch assistance.   Maternal Data Has patient been taught Hand Expression?: Yes Does the patient have breastfeeding experience prior to this delivery?: Yes How long did the patient breastfeed?: Last child for one year  Feeding Mother's Current Feeding Choice: Breast Milk  LATCH Score Latch: Grasps breast easily, tongue down, lips flanged, rhythmical sucking.  Audible Swallowing: Spontaneous and intermittent  Type of Nipple: Everted at rest and after stimulation  Comfort (Breast/Nipple): Soft / non-tender  Hold (Positioning): Assistance needed to correctly position infant at breast and maintain latch.  LATCH Score: 9   Lactation Tools Discussed/Used    Interventions Interventions: Breast feeding basics reviewed;Education  Discharge    Consult Status Consult Status: Follow-up Date: 05/22/21 Follow-up type: In-patient    Joshual Terrio R Laterra Lubinski 05/21/2021, 5:09 PM

## 2021-05-21 NOTE — Anesthesia Procedure Notes (Signed)
Epidural Patient location during procedure: OB Start time: 05/21/2021 10:41 AM End time: 05/21/2021 10:49 AM  Preanesthetic Checklist Completed: patient identified, IV checked, site marked, risks and benefits discussed, surgical consent, monitors and equipment checked, pre-op evaluation and timeout performed  Epidural Patient position: sitting Prep: DuraPrep and site prepped and draped Patient monitoring: continuous pulse ox and blood pressure Approach: midline Location: L4-L5 Injection technique: LOR air  Needle:  Needle type: Tuohy  Needle gauge: 17 G Needle length: 9 cm and 9 Needle insertion depth: 6 cm Catheter type: closed end flexible Catheter size: 19 Gauge Catheter at skin depth: 11 cm Test dose: negative and Other  Assessment Events: blood not aspirated, injection not painful, no injection resistance, no paresthesia and negative IV test  Additional Notes Patient identified. Risks and benefits discussed including failed block, incomplete  Pain control, post dural puncture headache, nerve damage, paralysis, blood pressure Changes, nausea, vomiting, reactions to medications-both toxic and allergic and post Partum back pain. All questions were answered. Patient expressed understanding and wished to proceed. Sterile technique was used throughout procedure. Epidural site was Dressed with sterile barrier dressing. No paresthesias, signs of intravascular injection Or signs of intrathecal spread were encountered.  Patient was more comfortable after the epidural was dosed. Please see RN's note for documentation of vital signs and FHR which are stable. Reason for block:procedure for pain

## 2021-05-21 NOTE — MAU Provider Note (Signed)
Event Date/Time   First Provider Initiated Contact with Patient 05/21/21 1005    S: Ms. Frayda Barnell is a 36 y.o. [id number] at [redacted]w[redacted]d  who presents to MAU today complaining contractions q 3 minutes since last night. She denies vaginal bleeding. She denies LOF. She reports normal fetal movement.    O: LMP 08/17/2020  GENERAL: Well-developed, well-nourished female in no acute distress.  HEAD: Normocephalic, atraumatic.  CHEST: Normal effort of breathing, regular heart rate ABDOMEN: Soft, nontender, gravid  Cervical exam:   6/60/-3   Fetal Monitoring: Baseline: 130 Variability: moderate Accelerations: 15x15 Decelerations: none Contractions: 2-3   A: SIUP at [redacted]w[redacted]d  Active labor  P: RN to call MD for admission  Erick Colace 05/21/2021 10:13 AM

## 2021-05-21 NOTE — H&P (Addendum)
Emma Hughes is a 36 y.o. female, G9 P4044, EGA 39+ weeks with EDC 2-14 presenting for ctx.  On eval in MAU, VE 6 cm with ctx.  PNC complicated by obesity with reactive NST, +GBS, clinda resistant, PCN allergy.  OB History     Gravida  10   Para  4   Term  4   Preterm      AB  4   Living  4      SAB  4   IAB      Ectopic      Multiple  0   Live Births  4          Past Medical History:  Diagnosis Date   Anemia    Anxiety    Depression    some problem in past, doing fine now   Gestational diabetes    Infection    UTI   Migraines    Past Surgical History:  Procedure Laterality Date   DILATION AND CURETTAGE OF UTERUS  2014   Family History: family history includes Heart disease in her father; Stroke in her mother. Social History:  reports that she has quit smoking. Her smoking use included cigarettes. She smoked an average of .25 packs per day. She has never used smokeless tobacco. She reports that she does not drink alcohol and does not use drugs.     Maternal Diabetes: No Genetic Screening: Normal Maternal Ultrasounds/Referrals: Normal Fetal Ultrasounds or other Referrals:  None Maternal Substance Abuse:  No Significant Maternal Medications:  None Significant Maternal Lab Results:  Group B Strep positive Other Comments:  None  Review of Systems  Respiratory: Negative.    Cardiovascular: Negative.   Maternal Medical History:  Reason for admission: Contractions.   Contractions: Perceived severity is moderate.   Fetal activity: Perceived fetal activity is normal.   Prenatal Complications - Diabetes: none.  Dilation: 6 Effacement (%): 70 Station: -2 Exam by:: Dr. Willis Modena Last menstrual period 08/17/2020, unknown if currently breastfeeding. Maternal Exam:  Uterine Assessment: Contraction strength is moderate.  Contraction frequency is regular.  Abdomen: Patient reports no abdominal tenderness. Estimated fetal weight is 8 lbs.   Fetal  presentation: vertex Introitus: Normal vulva. Normal vagina.  Amniotic fluid character: not assessed. Pelvis: adequate for delivery.     Fetal Exam Fetal Monitor Review: Mode: ultrasound.   Baseline rate: 140.  Variability: moderate (6-25 bpm).   Pattern: accelerations present and no decelerations.   Fetal State Assessment: Category I - tracings are normal.  Physical Exam Vitals reviewed.  Constitutional:      Appearance: Normal appearance.  Cardiovascular:     Rate and Rhythm: Normal rate and regular rhythm.  Pulmonary:     Effort: Pulmonary effort is normal. No respiratory distress.  Abdominal:     Palpations: Abdomen is soft.  Genitourinary:    General: Normal vulva.  Neurological:     Mental Status: She is alert.    Prenatal labs: ABO, Rh: --/--/PENDING (02/11 1015)  O neg Antibody: PENDING (02/11 1015)  neg Rubella: Immune (08/15 0000) RPR: Nonreactive (08/15 0000)  HBsAg: Negative (08/15 0000)  HIV: Non-reactive (08/15 0000)  GBS: Positive/-- (01/23 0000)   Assessment/Plan: IUP at 39+ weeks in active labor, GBS pos, clinda resistant, PCN allergy.  Will admit, Vancomycin for +GBS, monitor progress, epidural, anticipate SVD   Blane Ohara Jung Yurchak 05/21/2021, 10:37 AM

## 2021-05-22 MED ORDER — RHO D IMMUNE GLOBULIN 1500 UNIT/2ML IJ SOSY
300.0000 ug | PREFILLED_SYRINGE | Freq: Once | INTRAMUSCULAR | Status: AC
  Administered 2021-05-22: 300 ug via INTRAVENOUS
  Filled 2021-05-22: qty 2

## 2021-05-22 NOTE — Anesthesia Postprocedure Evaluation (Signed)
Anesthesia Post Note  Patient: Emma Hughes  Procedure(s) Performed: AN AD HOC LABOR EPIDURAL     Patient location during evaluation: Mother Baby Anesthesia Type: Epidural Level of consciousness: awake and alert Pain management: pain level controlled Vital Signs Assessment: post-procedure vital signs reviewed and stable Respiratory status: spontaneous breathing, nonlabored ventilation and respiratory function stable Cardiovascular status: stable Postop Assessment: no headache, no backache, epidural receding, no apparent nausea or vomiting, patient able to bend at knees, adequate PO intake and able to ambulate Anesthetic complications: no   No notable events documented.  Last Vitals:  Vitals:   05/22/21 0537 05/22/21 1517  BP: 112/81 124/73  Pulse: 73 69  Resp: 18 18  Temp: 36.9 C 36.8 C  SpO2:  99%    Last Pain:  Vitals:   05/22/21 1527  TempSrc:   PainSc: 5    Pain Goal:                   [place]

## 2021-05-22 NOTE — Plan of Care (Signed)
Problem: Education: Goal: Knowledge of General Education information will improve Description: Including pain rating scale, medication(s)/side effects and non-pharmacologic comfort measures Outcome: Completed/Met   Problem: Clinical Measurements: Goal: Ability to maintain clinical measurements within normal limits will improve Outcome: Completed/Met Goal: Will remain free from infection Outcome: Completed/Met   

## 2021-05-22 NOTE — Lactation Note (Signed)
This note was copied from a baby's chart. Lactation Consultation Note  Patient Name: Boy Sana Tessmer XAJOI'N Date: 05/22/2021 Reason for consult: Follow-up assessment;Mother's request;Difficult latch;Term;Breastfeeding assistance;Maternal endocrine disorder Age:35 hours  Mom encouraged to compress the breast in midst of feeding to offer more volume so feedings can be longer. LC assisted with latching infant in cross cradle prone with signs of milk transfer.   Plan 1. To feed by cues 8-12x 24hr period.  Mom to offer breasts and look for signs of milk transfer.  2. Mom to supplement with EBM 7-12 ml per feeding with pace bottle feeding with yellow slow flow nipple.  3. Mom to use DEBP q 3hrs for 15 min   Infant long time period between urine and stool output. Mom work on offering more volume with changes stated above.  All questions answered at the end of the visit.   Maternal Data Has patient been taught Hand Expression?: Yes  Feeding Mother's Current Feeding Choice: Breast Milk  LATCH Score Latch: Repeated attempts needed to sustain latch, nipple held in mouth throughout feeding, stimulation needed to elicit sucking reflex.  Audible Swallowing: A few with stimulation  Type of Nipple: Everted at rest and after stimulation  Comfort (Breast/Nipple): Soft / non-tender  Hold (Positioning): Assistance needed to correctly position infant at breast and maintain latch.  LATCH Score: 7   Lactation Tools Discussed/Used Tools: Pump;Flanges Flange Size: 24 Breast pump type: Double-Electric Breast Pump Pump Education: Setup, frequency, and cleaning;Milk Storage Reason for Pumping: increase stimulation Pumping frequency: every 3 hrs for 15 min  Interventions Interventions: Breast feeding basics reviewed;Support pillows;Education;Assisted with latch;Position options;Pace feeding;Skin to skin;Expressed milk;Breast massage;Coconut oil;Hand express;Infant Driven Feeding Algorithm  education;DEBP;Breast compression;Adjust position  Discharge    Consult Status Consult Status: Follow-up Date: 05/23/21 Follow-up type: In-patient    Olufemi Mofield  Nicholson-Springer 05/22/2021, 5:01 PM

## 2021-05-22 NOTE — Progress Notes (Signed)
PPD #1 No problems Afeb, VSS Fundus firm, NT at U-1 Continue routine postpartum care 

## 2021-05-22 NOTE — Social Work (Signed)
MOB was referred for history of depression and anxiety.   * Referral screened out by Clinical Social Worker because none of the following criteria appear to apply:  ~ History of anxiety/depression during this pregnancy, or of post-partum depression following prior delivery. ~ Diagnosis of anxiety and/or depression within last 3 years. OR * MOB's symptoms currently being treated with medication and/or therapy.  Please contact the Clinical Social Worker if needs arise, by MOB request, or if MOB scores greater than 9/yes to question 10 on Edinburgh Postpartum Depression Screen.  Charene Mccallister, LCSWA Clinical Social Work [facility]  [phone]  

## 2021-05-23 LAB — RH IG WORKUP (INCLUDES ABO/RH)
Fetal Screen: NEGATIVE
Gestational Age(Wks): 39.4
Unit division: 0

## 2021-05-23 MED ORDER — IBUPROFEN 800 MG PO TABS: 800.0000 mg | ORAL_TABLET | Freq: Three times a day (TID) | ORAL | 1 refills | Status: AC | PRN

## 2021-05-23 NOTE — Progress Notes (Signed)
POSTPARTUM PROGRESS NOTE  Post Partum Day #2  Subjective:  No acute events overnight.  Pt denies problems with ambulating, voiding or po intake.  She denies nausea or vomiting.  Pain is well controlled.  Lochia Minimal.   Objective: Blood pressure 116/70, pulse 88, temperature 98.1 F (36.7 C), temperature source Oral, resp. rate 16, height 5\' 2"  (1.575 m), weight 98.4 kg, last menstrual period 08/17/2020, SpO2 98 %, unknown if currently breastfeeding.  Physical Exam:  General: alert, cooperative and no distress Lochia:normal flow Chest: CTAB Heart: RRR no m/r/g Abdomen: +BS, soft, nontender Uterine Fundus: firm, 2cm below umbilicus Extremities: neg edema, neg calf TTP BL, neg Homans BL  Recent Labs    05/21/21 1009  HGB 10.1*  HCT 34.2*    Assessment/Plan:  ASSESSMENT: Emma Hughes is a 36 y.o. [id number] s/p SVD @ [redacted]w[redacted]d. PNC c/b GBS pos, RH neg.   Discharge home and Breastfeeding Baby boy s/p circ PPD#1   LOS: 2 days

## 2021-05-23 NOTE — Discharge Summary (Signed)
Postpartum Discharge Summary  Date of Service updated     Patient Name: Emma Hughes DOB: 1986-03-13 MRN: [id number]  Date of admission: 05/21/2021 Delivery date:05/21/2021  Delivering provider: Willis Modena, TODD  Date of discharge: 05/23/2021  Admitting diagnosis: Post term pregnancy [O48.0] Intrauterine pregnancy: [redacted]w[redacted]d     Secondary diagnosis:  Principal Problem:   Post term pregnancy  Additional problems: RH neg, GBS pos    Discharge diagnosis: Term Pregnancy Delivered                                              Post partum procedures:rhogam Augmentation: AROM Complications: None  Hospital course: Onset of Labor With Vaginal Delivery      36 y.o. yo [id number] at [redacted]w[redacted]d was admitted in Active Labor on 05/21/2021. Patient had an uncomplicated labor course as follows:  Membrane Rupture Time/Date: 1:56 PM ,05/21/2021   Delivery Method:Vaginal, Spontaneous  Episiotomy: None  Lacerations:  Periurethral  Patient had an uncomplicated postpartum course.  She is ambulating, tolerating a regular diet, passing flatus, and urinating well. Patient is discharged home in stable condition on 05/23/21.  Newborn Data: Birth date:05/21/2021  Birth time:2:14 PM  Gender:Female  Living status:Living  Apgars:8 ,9  Weight:3385 g   Rhophylac:Yes  Physical exam  Vitals:   05/22/21 0126 05/22/21 0537 05/22/21 1517 05/23/21 0629  BP: 119/84 112/81 124/73 116/70  Pulse: 67 73 69 88  Resp: 18 18 18 16   Temp: 97.9 F (36.6 C) 98.4 F (36.9 C) 98.3 F (36.8 C) 98.1 F (36.7 C)  TempSrc: Oral Oral Oral Oral  SpO2:   99% 98%  Weight:      Height:       General: alert, cooperative, and no distress Lochia: appropriate Uterine Fundus: firm Incision: N/A DVT Evaluation: No evidence of DVT seen on physical exam. Negative Homan's sign. No cords or calf tenderness. Labs: Lab Results  Component Value Date   WBC 11.7 (H) 05/21/2021   HGB 10.1 (L) 05/21/2021   HCT 34.2 (L) 05/21/2021   MCV  75.2 (L) 05/21/2021   PLT 290 05/21/2021   CMP Latest Ref Rng & Units 01/04/2021  Glucose 70 - 99 mg/dL 87  BUN 6 - 20 mg/dL 8  Creatinine 0.44 - 1.00 mg/dL 0.55  Sodium 135 - 145 mmol/L 131(L)  Potassium 3.5 - 5.1 mmol/L 3.5  Chloride 98 - 111 mmol/L 103  CO2 22 - 32 mmol/L 21(L)  Calcium 8.9 - 10.3 mg/dL 8.5(L)  Total Protein 6.5 - 8.1 g/dL 6.3(L)  Total Bilirubin 0.3 - 1.2 mg/dL 0.6  Alkaline Phos 38 - 126 U/L 67  AST 15 - 41 U/L 25  ALT 0 - 44 U/L 24   Edinburgh Score: Edinburgh Postnatal Depression Scale Screening Tool 05/22/2021  I have been able to laugh and see the funny side of things. 0  I have looked forward with enjoyment to things. 0  I have blamed myself unnecessarily when things went wrong. 0  I have been anxious or worried for no good reason. 0  I have felt scared or panicky for no good reason. 0  Things have been getting on top of me. 0  I have been so unhappy that I have had difficulty sleeping. 0  I have felt sad or miserable. 0  I have been so unhappy that I have been crying. 0  The thought of harming myself has occurred to me. 0  Edinburgh Postnatal Depression Scale Total 0      After visit meds:  Allergies as of 05/23/2021       Reactions   Penicillins Anaphylaxis   DID THE REACTION INVOLVE: Swelling of the face/tongue/throat, SOB, or low BP? No Sudden or severe rash/hives, skin peeling, or the inside of the mouth or nose? No Did it require medical treatment? No When did it last happen?       If all above answers are NO, may proceed with cephalosporin use.        Medication List     STOP taking these medications    acetaminophen 500 MG tablet Commonly known as: TYLENOL   famotidine 20 MG tablet Commonly known as: PEPCID       TAKE these medications    calcium carbonate 750 MG chewable tablet Commonly known as: TUMS EX Chew 1-2 tablets by mouth daily as needed for heartburn.   ibuprofen 800 MG tablet Commonly known as:  ADVIL Take 1 tablet (800 mg total) by mouth every 8 (eight) hours as needed.         Discharge home in stable condition Infant Feeding: Breast Infant Disposition:home with mother Discharge instruction: per After Visit Summary and Postpartum booklet. Activity: Advance as tolerated. Pelvic rest for 6 weeks.  Diet: routine diet Anticipated Birth Control: Unsure Postpartum Appointment:6 weeks Follow up Visit:[facility]    05/23/2021 Deliah Boston, MD

## 2021-05-26 ENCOUNTER — Inpatient Hospital Stay (HOSPITAL_COMMUNITY): Admission: AD | Admit: 2021-05-26 | Payer: Medicaid Other | Source: Home / Self Care | Admitting: Obstetrics

## 2021-05-26 ENCOUNTER — Inpatient Hospital Stay (HOSPITAL_COMMUNITY): Payer: Medicaid Other

## 2021-05-31 ENCOUNTER — Telehealth (HOSPITAL_COMMUNITY): Payer: Self-pay

## 2021-05-31 NOTE — Telephone Encounter (Signed)
No answer. Left message to return nurse call.  Sharyn Lull [facility] 05/31/2021,1452

## 2023-02-12 IMAGING — US US MFM OB DETAIL+14 WK
1 series · 13 of 28 positions shown · non-contrast
Comparison: none

[Series 1: us mfm ob detail+14 wk · 75 acquisitions, 13 frames shown]
[im 3/75]
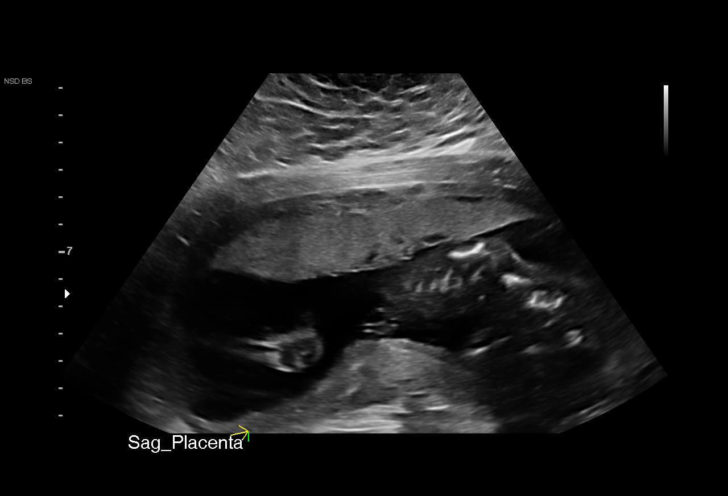
[im 9/75]
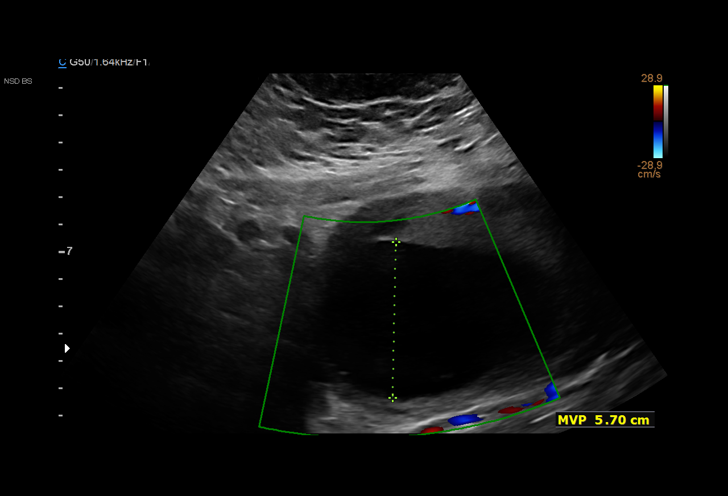
[im 14/75]
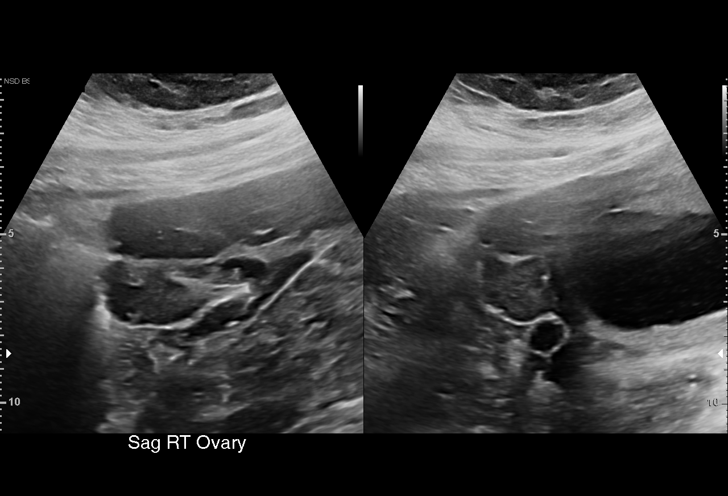
[im 20/75]
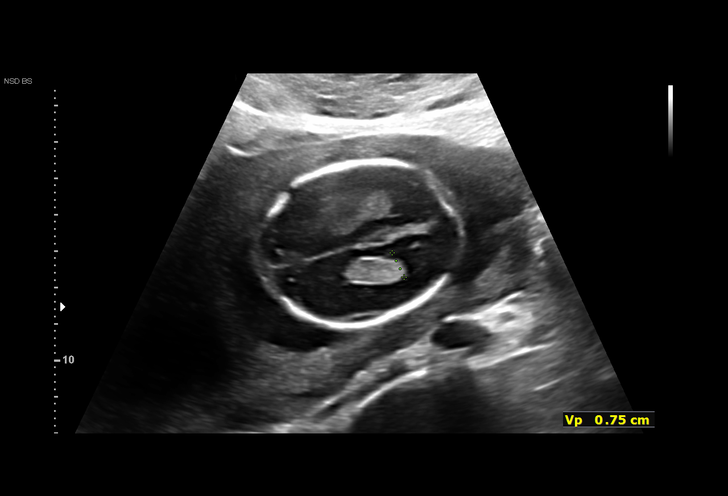
[im 25/75]
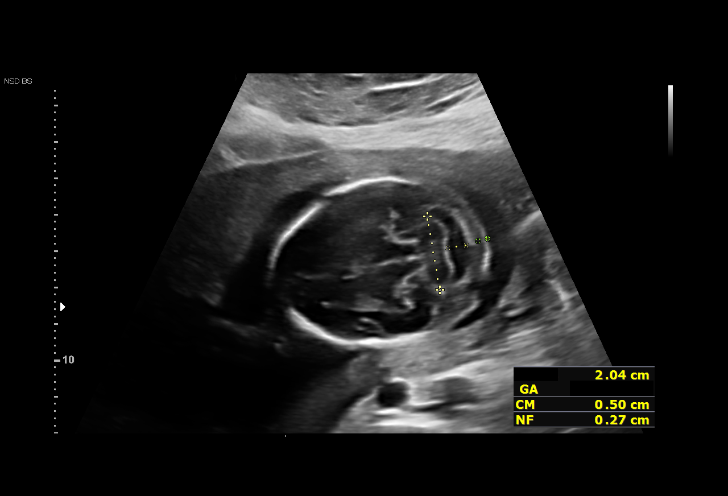
[im 31/75]
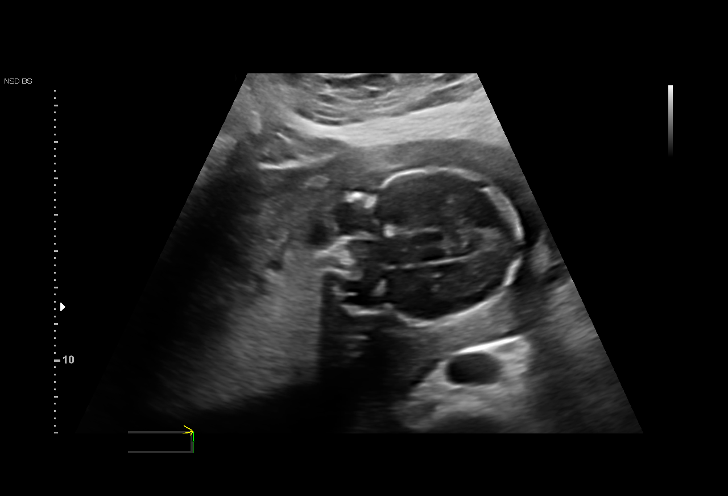
[im 39/75]
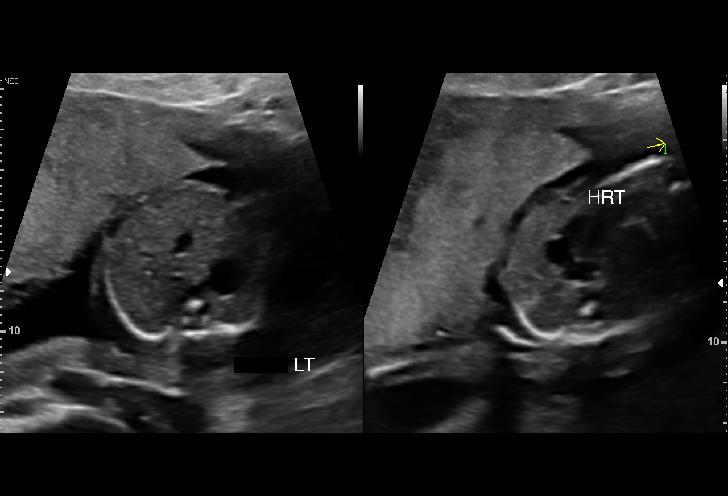
[im 44/75]
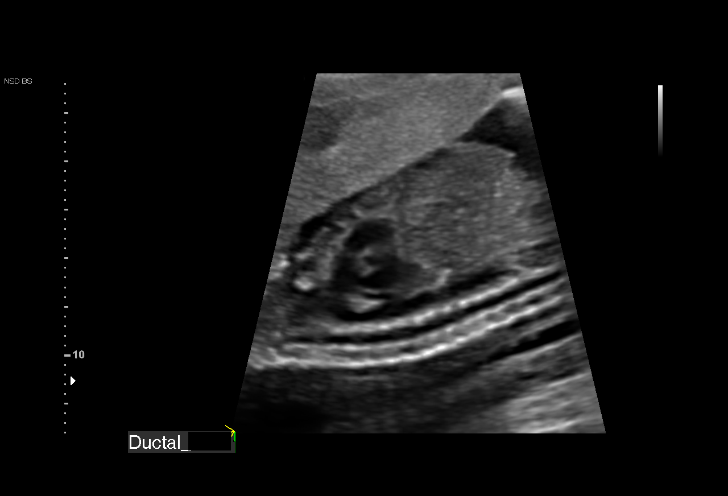
[im 50/75]
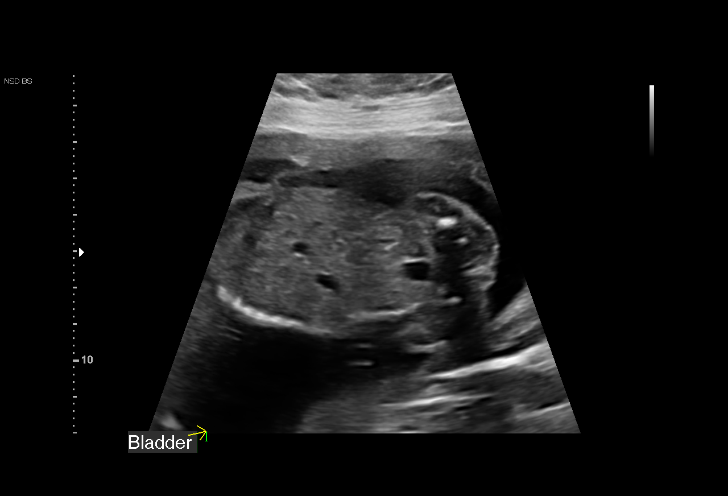
[im 55/75]
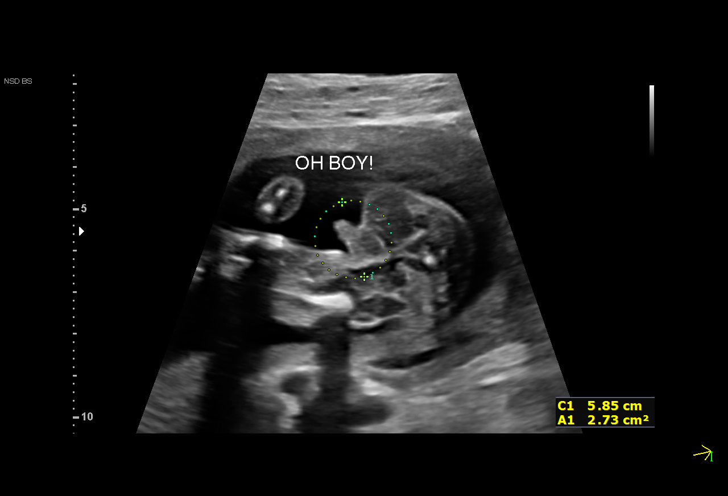
[im 61/75]
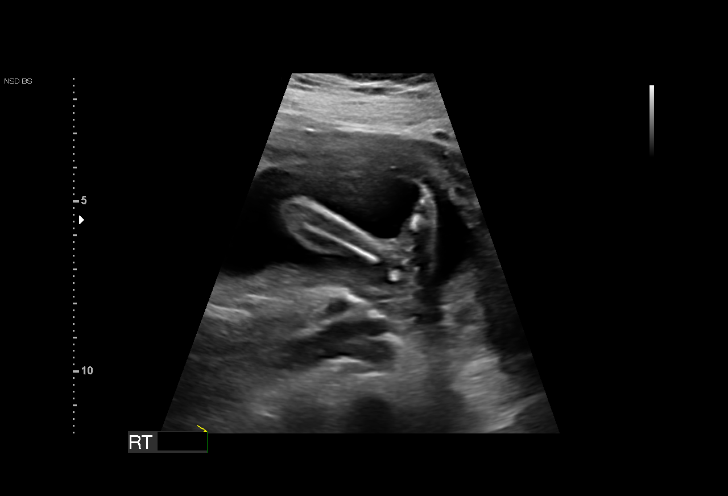
[im 66/75]
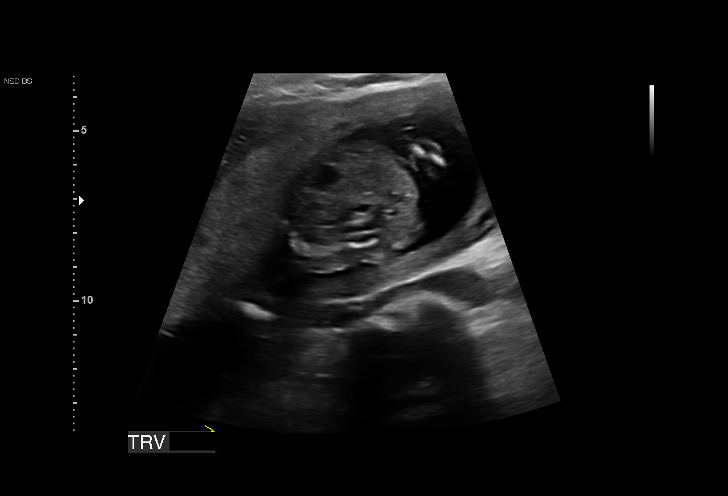
[im 72/75]
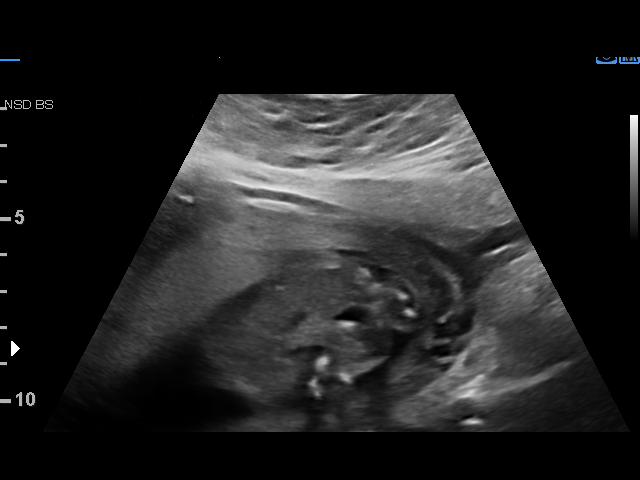

[13 of 28 positions shown; findings below may reference images not displayed]

OBGYN

Indications

 Poor obstetric history: Previous gestational
 diabetes
 19 weeks gestation of pregnancy
 Encounter for antenatal screening for
 malformations
 Obesity complicating pregnancy, second
 trimester (BMI 35)
 Medical complication of pregnancy (Covid +
 @15 weeks)
 Rh negative state in antepartum
 Other umbilical cord
 complications,antepartum condition or
 complication (? cyst)
Fetal Evaluation

 Num Of Fetuses:         1
 Fetal Heart Rate(bpm):  153
 Cardiac Activity:       Observed
 Presentation:           Cephalic
 Placenta:               Anterior
 P. Cord Insertion:      Visualized
 Amniotic Fluid
 AFI FV:      Within normal limits

                             Largest Pocket(cm)

Biometry

 BPD:      43.4  mm     G. Age:  19w 1d         50  %    CI:        68.44   %    70 - 86
                                                         FL/HC:      17.6   %    16.1 -
 HC:      167.7  mm     G. Age:  19w 3d         57  %    HC/AC:      1.16        1.09 -
 AC:      144.6  mm     G. Age:  19w 5d         67  %    FL/BPD:     68.0   %
 FL:       29.5  mm     G. Age:  19w 1d         41  %    FL/AC:      20.4   %    20 - 24
 HUM:      29.2  mm     G. Age:  19w 4d         60  %
 CER:      20.4  mm     G. Age:  19w 4d         63  %
 NFT:       3.2  mm

 LV:        7.5  mm
 CM:          5  mm

 Est. FW:     293  gm    0 lb 10 oz      64  %
OB History

 Gravidity:    9         Term:   4        Prem:   0        SAB:   4
 TOP:          0       Ectopic:  0        Living: 4
Gestational Age

 LMP:           19w 1d        Date:  08/17/20                 EDD:   05/24/21
 U/S Today:     19w 3d                                        EDD:   05/22/21
 Best:          19w 1d     Det. By:  LMP  (08/17/20)          EDD:   05/24/21
Anatomy

 Cranium:               Appears normal         Aortic Arch:            Not well visualized
 Cavum:                 Appears normal         Ductal Arch:            Appears normal
 Ventricles:            Appears normal         Diaphragm:              Appears normal
 Choroid Plexus:        Appears normal         Stomach:                Appears normal, left
                                                                       sided
 Cerebellum:            Appears normal         Abdomen:                Appears normal
 Posterior Fossa:       Appears normal         Abdominal Wall:         Not well visualized
 Nuchal Fold:           Appears normal         Cord Vessels:           Appears normal (3
                                                                       vessel cord)
 Face:                  Orbits appear          Kidneys:                Appear normal
                        normal
 Lips:                  Not well visualized    Bladder:                Appears normal
 Palate:                Not well visualized    Spine:                  Ltd views no
                                                                       intracranial signs of
                                                                       NTD
 Heart:                 Not well visualized    Upper Extremities:      Visualized
 RVOT:                  Appears normal         Lower Extremities:      Appears normal
 LVOT:                  Appears normal

 Other:  Fetus appears to be a male. Technically difficult due to maternal
         habitus and fetal position. 3VV visualized.
Cervix Uterus Adnexa
 Cervix
 Length:            4.8  cm.
 Normal appearance by transabdominal scan.

 Right Ovary
 Within normal limits.

 Left Ovary
 Within normal limits.

 Adnexa
 No abnormality visualized.
Impression

 Single intrauterine pregnancy here for a detailed anatomy
 due to elevated BMI.
 Normal anatomy with measurements consistent with dates
 There is good fetal movement and amniotic fluid volume
 Suboptimal views of the fetal anatomy were obtained
 secondary to fetal position.
Recommendations

 Follow up growth in 4 weeks to complete the fetal anatomy.

## 2023-02-19 IMAGING — MR MR HEAD W/O CM
12 of 13 series · 44 of 48 positions shown · non-contrast
Comparison: 05/08/2011 head CT

CLINICAL DATA: Headache.

EXAM:
MRI HEAD WITHOUT CONTRAST
MRV HEAD WITHOUT CONTRAST
TECHNIQUE: Multiplanar, multi-echo pulse sequences of the brain and surrounding
structures were acquired without intravenous contrast. Angiographic
images of the intracranial venous structures were acquired using MRV
technique without intravenous contrast.

[Series 5: DWI · axial · 3.0mm · 0.92mm/px · z∈[-99,+63]mm · 8 of 110 slices shown (1 of 4)]
[im 1/110]
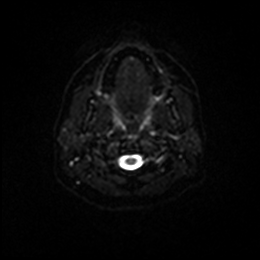
[im 16/110]
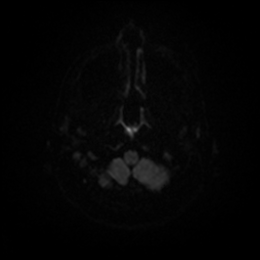
[im 32/110]
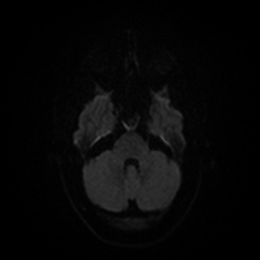
[im 47/110]
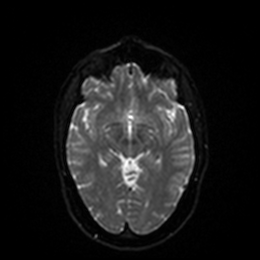
[im 63/110]
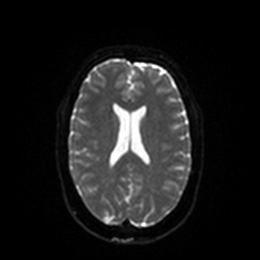
[im 78/110]
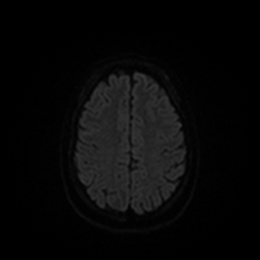
[im 94/110]
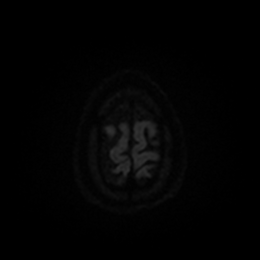
[im 110/110]
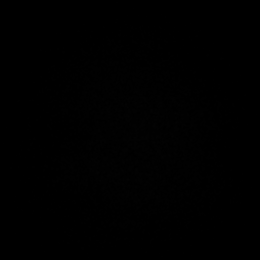

[Series 6: DWI · axial · 3.0mm · 0.92mm/px · z∈[-99,+60]mm · 4 of 54 slices shown (2 of 4)]
[im 1/54]
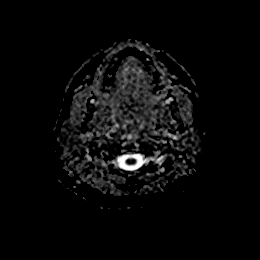
[im 18/54]
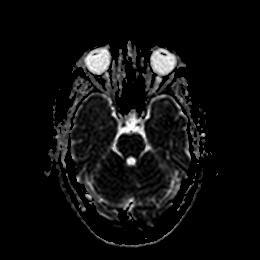
[im 36/54]
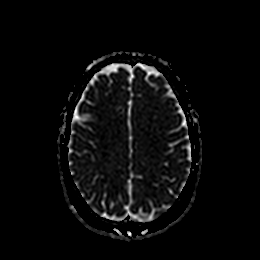
[im 54/54]
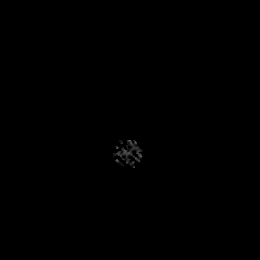

[Series 7: DWI · coronal · 4.0mm · 0.88mm/px · 5 of 76 slices shown (3 of 4)]
[im 1/76]
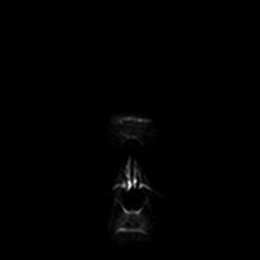
[im 19/76]
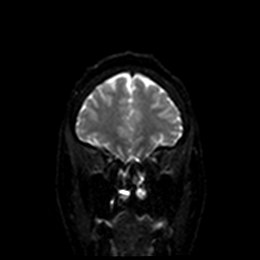
[im 38/76]
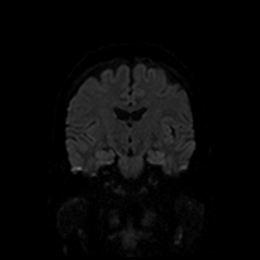
[im 57/76]
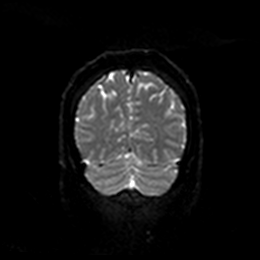
[im 76/76]
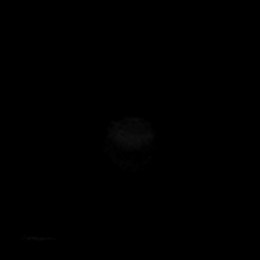

[Series 8: DWI · coronal · 4.0mm · 0.88mm/px · 3 of 38 slices shown (4 of 4)]
[im 1/38]
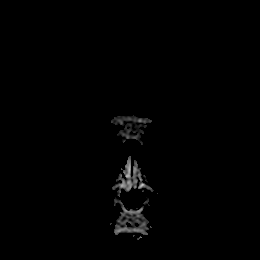
[im 19/38]
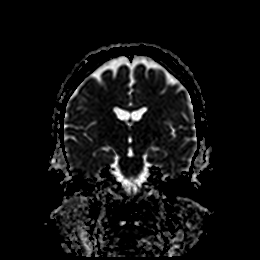
[im 38/38]
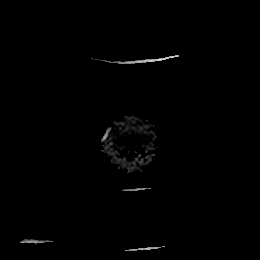

[Series 9: T1 · sagittal · 5.0mm · 0.75mm/px · 2 of 27 slices shown]
[im 1/27]
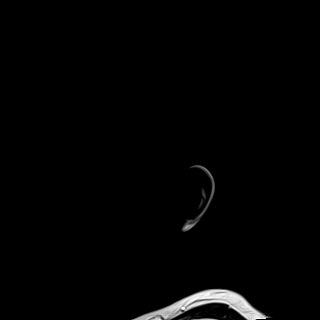
[im 27/27]
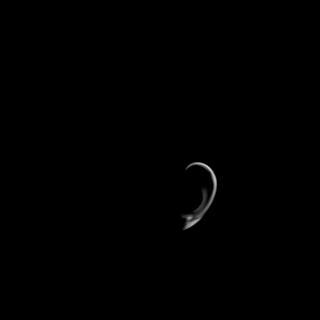

[Series 10: T2 · axial · 5.0mm · 0.75mm/px · z∈[-102,+60]mm · 2 of 28 slices shown (1 of 2)]
[im 1/28]
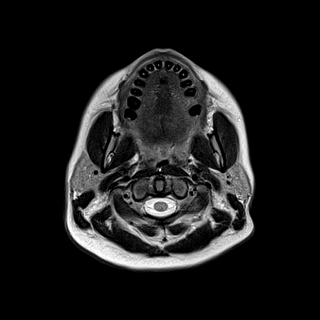
[im 28/28]
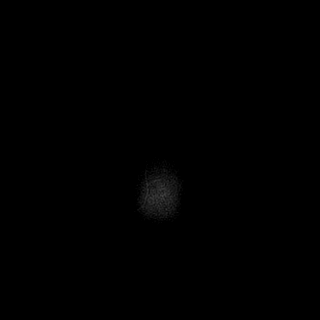

[Series 11: FLAIR · axial · 5.0mm · 0.47mm/px · z∈[-95,+61]mm · 2 of 27 slices shown]
[im 1/27]
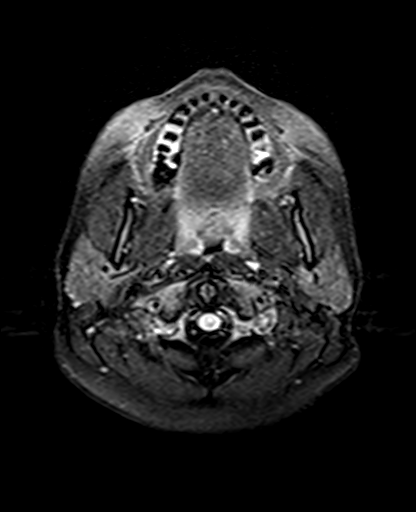
[im 27/27]
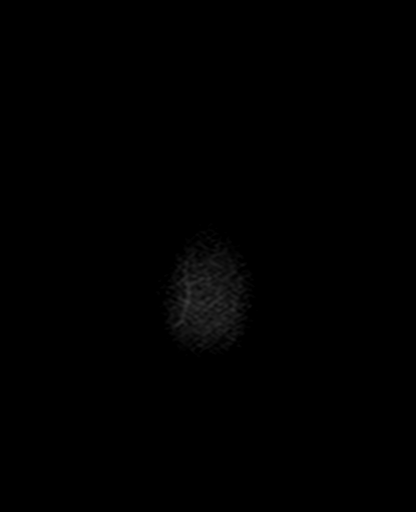

[Series 12: mag_images · axial · 3.0mm · 0.94mm/px · z∈[-100,+65]mm · 4 of 56 slices shown]
[im 1/56]
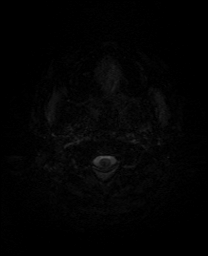
[im 19/56]
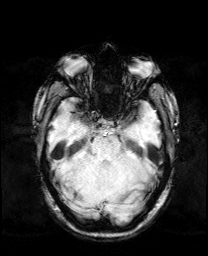
[im 37/56]
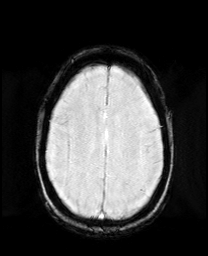
[im 56/56]
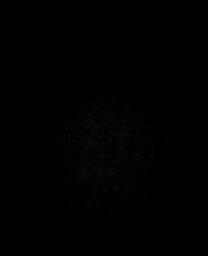

[Series 13: pha_images · axial · 3.0mm · 0.94mm/px · z∈[-100,+65]mm · 4 of 55 slices shown]
[im 1/55]
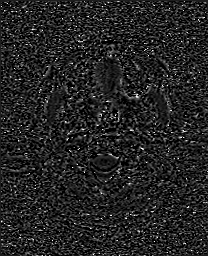
[im 19/55]
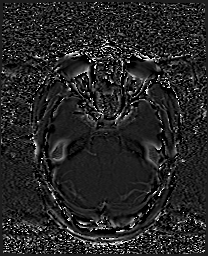
[im 37/55]
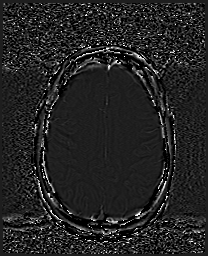
[im 55/55]
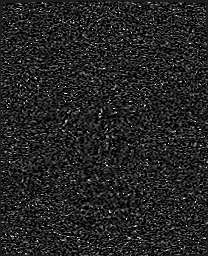

[Series 14: swi_images · axial · 3.0mm · 0.94mm/px · z∈[-100,+65]mm · 4 of 56 slices shown]
[im 1/56]
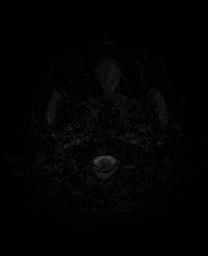
[im 19/56]
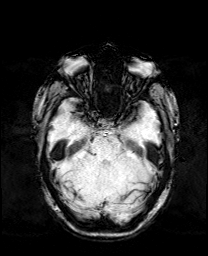
[im 37/56]
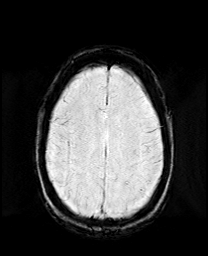
[im 56/56]
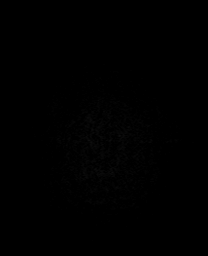

[Series 15: mip_images(sw) · axial · 24.0mm · 0.94mm/px · z∈[-89,+55]mm · 4 of 49 slices shown]
[im 1/49]
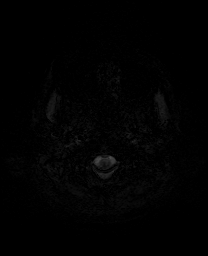
[im 17/49]
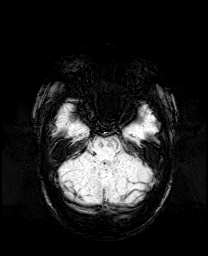
[im 33/49]
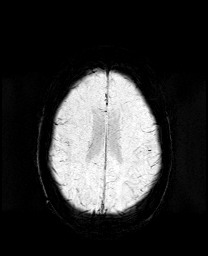
[im 49/49]
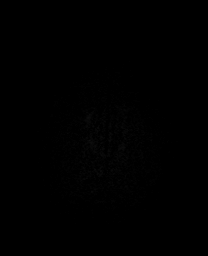

[Series 17: T2 · coronal · 5.0mm · 0.34mm/px · 2 of 32 slices shown (2 of 2)]
[im 1/32]
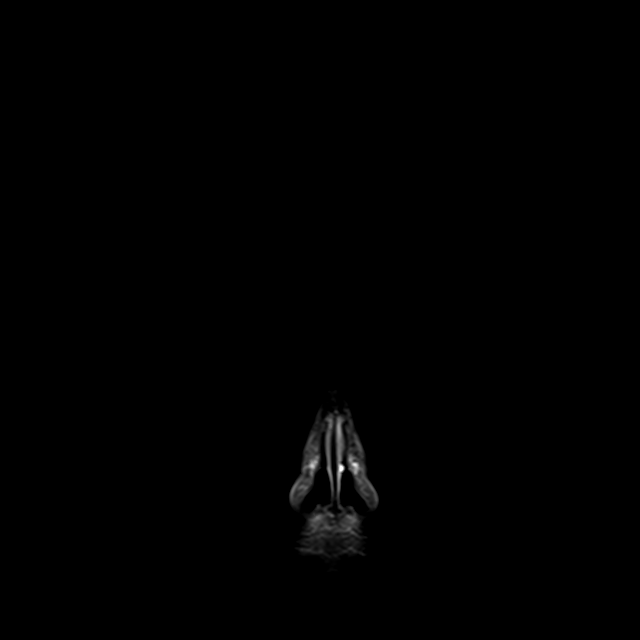
[im 32/32]
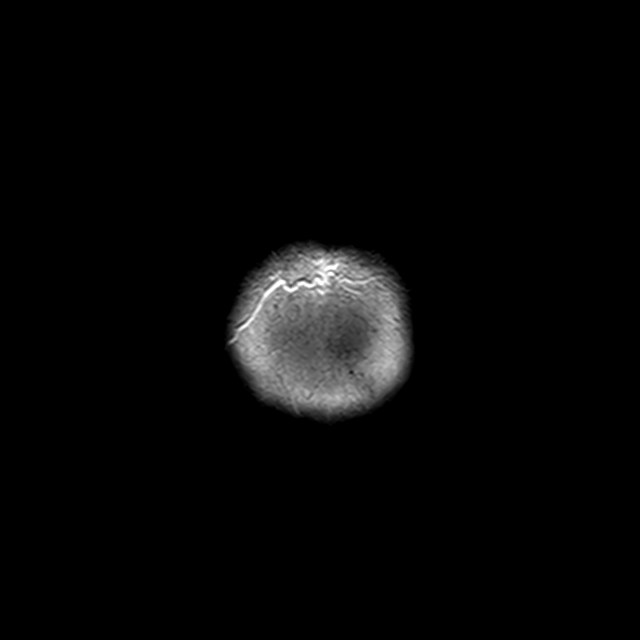

[44 of 48 positions shown; findings below may reference images not displayed]

FINDINGS: MRI HEAD WITHOUT CONTRAST

Brain: No acute infarction, hemorrhage, hydrocephalus, extra-axial
collection or mass lesion.

Vascular: Normal flow voids.

Skull and upper cervical spine: No focal marrow lesion. Relatively
hypointense marrow signal diffusely, likely physiologic in the
setting of pregnancy.

Sinuses/Orbits: Essentially clear sinuses and negative orbits.

MR VENOGRAM WITHOUT CONTRAST

Dural sinuses are smoothly contoured and diffusely patent with
dominant right transverse sigmoid drainage. Symmetric flow in paired
deep veins. No evidence of cortical vein thrombosis.
IMPRESSION: Negative brain MRI and MRV.
